# Patient Record
Sex: Male | Born: 1980 | Hispanic: Yes | Marital: Married | Smoking: Former smoker
Health system: Southern US, Community
[De-identification: ages and names within clinical notes are randomized; demographics above are authoritative.]

## PROBLEM LIST (undated history)

## (undated) DIAGNOSIS — K5792 Diverticulitis of intestine, part unspecified, without perforation or abscess without bleeding: Secondary | ICD-10-CM

## (undated) DIAGNOSIS — F319 Bipolar disorder, unspecified: Secondary | ICD-10-CM

## (undated) DIAGNOSIS — F329 Major depressive disorder, single episode, unspecified: Secondary | ICD-10-CM

## (undated) DIAGNOSIS — F32A Depression, unspecified: Secondary | ICD-10-CM

## (undated) DIAGNOSIS — J45909 Unspecified asthma, uncomplicated: Secondary | ICD-10-CM

## (undated) HISTORY — DX: Diverticulitis of intestine, part unspecified, without perforation or abscess without bleeding: K57.92

## (undated) HISTORY — DX: Unspecified asthma, uncomplicated: J45.909

---

## 2010-08-05 ENCOUNTER — Emergency Department (INDEPENDENT_AMBULATORY_CARE_PROVIDER_SITE_OTHER): Payer: Self-pay

## 2010-08-05 ENCOUNTER — Emergency Department (HOSPITAL_BASED_OUTPATIENT_CLINIC_OR_DEPARTMENT_OTHER)
Admission: EM | Admit: 2010-08-05 | Discharge: 2010-08-05 | Disposition: A | Discharge status: Home / Self Care | Payer: Self-pay | Attending: Emergency Medicine | Admitting: Emergency Medicine

## 2010-08-05 DIAGNOSIS — J189 Pneumonia, unspecified organism: Secondary | ICD-10-CM | POA: Insufficient documentation

## 2010-08-05 DIAGNOSIS — R05 Cough: Secondary | ICD-10-CM

## 2010-08-05 DIAGNOSIS — J45909 Unspecified asthma, uncomplicated: Secondary | ICD-10-CM | POA: Insufficient documentation

## 2010-08-05 DIAGNOSIS — R059 Cough, unspecified: Secondary | ICD-10-CM

## 2010-08-05 DIAGNOSIS — R0602 Shortness of breath: Secondary | ICD-10-CM | POA: Insufficient documentation

## 2012-11-06 ENCOUNTER — Emergency Department (HOSPITAL_BASED_OUTPATIENT_CLINIC_OR_DEPARTMENT_OTHER): Payer: Self-pay

## 2012-11-06 ENCOUNTER — Emergency Department (HOSPITAL_BASED_OUTPATIENT_CLINIC_OR_DEPARTMENT_OTHER)
Admission: EM | Admit: 2012-11-06 | Discharge: 2012-11-06 | Disposition: A | Discharge status: Home / Self Care | Payer: Self-pay | Attending: Emergency Medicine | Admitting: Emergency Medicine

## 2012-11-06 ENCOUNTER — Encounter (HOSPITAL_BASED_OUTPATIENT_CLINIC_OR_DEPARTMENT_OTHER): Payer: Self-pay | Admitting: Emergency Medicine

## 2012-11-06 DIAGNOSIS — R112 Nausea with vomiting, unspecified: Secondary | ICD-10-CM | POA: Insufficient documentation

## 2012-11-06 DIAGNOSIS — R0602 Shortness of breath: Secondary | ICD-10-CM | POA: Insufficient documentation

## 2012-11-06 DIAGNOSIS — R091 Pleurisy: Secondary | ICD-10-CM | POA: Insufficient documentation

## 2012-11-06 DIAGNOSIS — D72829 Elevated white blood cell count, unspecified: Secondary | ICD-10-CM | POA: Insufficient documentation

## 2012-11-06 LAB — HEPATIC FUNCTION PANEL
ALT: 32 U/L (ref 0–53)
AST: 26 U/L (ref 0–37)
Bilirubin, Direct: <0.1 mg/dL (ref 0.0–0.3)
Total Protein: 7.4 g/dL (ref 6.0–8.3)

## 2012-11-06 LAB — TROPONIN I
Troponin I: <0.3 ng/mL (ref <0.30)
Troponin I: <0.3 ng/mL (ref <0.30)

## 2012-11-06 LAB — BASIC METABOLIC PANEL
CO2: 23 mEq/L (ref 19–32)
Chloride: 101 mEq/L (ref 96–112)
Creatinine, Ser: 0.7 mg/dL (ref 0.50–1.35)
GFR calc Af Amer: >90 mL/min (ref >90)
Sodium: 137 mEq/L (ref 135–145)

## 2012-11-06 LAB — PRO B NATRIURETIC PEPTIDE: Pro B Natriuretic peptide (BNP): 13.1 pg/mL (ref 0–125)

## 2012-11-06 LAB — CBC
HCT: 46.5 % (ref 39.0–52.0)
Hemoglobin: 16.5 g/dL (ref 13.0–17.0)
RBC: 5.48 MIL/uL (ref 4.22–5.81)
WBC: 15.6 10*3/uL — ABNORMAL HIGH (ref 4.0–10.5)

## 2012-11-06 LAB — D-DIMER, QUANTITATIVE: D-Dimer, Quant: 0.29 ug/mL-FEU (ref 0.00–0.48)

## 2012-11-06 MED ORDER — HYDROMORPHONE HCL PF 1 MG/ML IJ SOLN
1.0000 mg | Freq: Once | INTRAMUSCULAR | Status: AC
  Administered 2012-11-06: 1 mg via INTRAVENOUS
  Filled 2012-11-06: qty 1

## 2012-11-06 MED ORDER — OXYCODONE-ACETAMINOPHEN 5-325 MG PO TABS: 2.0000 | ORAL_TABLET | ORAL | Status: DC | PRN

## 2012-11-06 MED ORDER — ONDANSETRON HCL 4 MG/2ML IJ SOLN
4.0000 mg | Freq: Once | INTRAMUSCULAR | Status: AC
  Administered 2012-11-06: 4 mg via INTRAVENOUS
  Filled 2012-11-06: qty 2

## 2012-11-06 MED ORDER — NAPROXEN 500 MG PO TABS: 500.0000 mg | ORAL_TABLET | Freq: Two times a day (BID) | ORAL | Status: DC

## 2012-11-06 MED ORDER — SIMETHICONE 80 MG PO CHEW
80.0000 mg | CHEWABLE_TABLET | Freq: Once | ORAL | Status: DC
  Filled 2012-11-06: qty 1

## 2012-11-06 MED ORDER — NITROGLYCERIN 0.4 MG SL SUBL
0.4000 mg | SUBLINGUAL_TABLET | SUBLINGUAL | Status: DC | PRN
  Administered 2012-11-06 (×2): 0.4 mg via SUBLINGUAL
  Filled 2012-11-06: qty 25

## 2012-11-06 MED ORDER — MORPHINE SULFATE 4 MG/ML IJ SOLN
4.0000 mg | Freq: Once | INTRAMUSCULAR | Status: AC
  Administered 2012-11-06: 4 mg via INTRAVENOUS
  Filled 2012-11-06: qty 1

## 2012-11-06 MED ORDER — KETOROLAC TROMETHAMINE 30 MG/ML IJ SOLN
30.0000 mg | Freq: Once | INTRAMUSCULAR | Status: AC
  Administered 2012-11-06: 30 mg via INTRAVENOUS
  Filled 2012-11-06: qty 1

## 2012-11-06 MED ORDER — PROMETHAZINE HCL 25 MG PO TABS: 25.0000 mg | ORAL_TABLET | Freq: Four times a day (QID) | ORAL | Status: DC | PRN

## 2012-11-06 MED ORDER — ASPIRIN 81 MG PO CHEW
324.0000 mg | CHEWABLE_TABLET | Freq: Once | ORAL | Status: AC
  Administered 2012-11-06: 324 mg via ORAL
  Filled 2012-11-06: qty 1

## 2012-11-06 NOTE — ED Notes (Signed)
Chest pain for three days that has gotten worse. Pt states pain shooting into his back. Took asa last night and helped but vomited this am and can't make pain stop

## 2012-11-06 NOTE — ED Provider Notes (Signed)
CSN: [id number]     Arrival date & time 11/06/12  1146 History     First MD Initiated Contact with Patient 11/06/12 1214     Chief Complaint  Patient presents with  . Chest Pain   (Consider location/radiation/quality/duration/timing/severity/associated sxs/prior Treatment) HPI Comments: Patient is a 32 year old male with no past medical history who presents with a 3 day history of chest pain. Symptoms started gradually and progressively worsened since the onset. The pain is located in his left chest with radiation to his back. The pain is pleuritic in nature. He reports the pain is sharp and severe. Patient reports associated shortness of breath. He also reports nausea and vomiting started today. He tried taking aspirin for symptoms with no relief. Patient denies any injury to the area. He has no family history of heart disease and he does not smoke. Patient denies recent surgery or previous PE/DVT. Patient states he returned from a car trip to [place] 1 week ago for work. No alleviating factors.    History reviewed. No pertinent past medical history. History reviewed. No pertinent past surgical history. No family history on file. History  Substance Use Topics  . Smoking status: Never Smoker   . Smokeless tobacco: Not on file  . Alcohol Use: Yes     Comment: occ    Review of Systems  Respiratory: Positive for shortness of breath.   Cardiovascular: Positive for chest pain.  Gastrointestinal: Positive for nausea and vomiting.  All other systems reviewed and are negative.    Allergies  Review of patient's allergies indicates no known allergies.  Home Medications  No current outpatient prescriptions on file. BP 141/101  Pulse 96  Temp(Src) 98 F (36.7 C) (Oral)  Resp 16  Ht 5\' 4"  (1.626 m)  Wt 176 lb (79.833 kg)  BMI 30.2 kg/m2  SpO2 100% Physical Exam  Nursing note and vitals reviewed. Constitutional: He is oriented to person, place, and time. He appears  well-developed and well-nourished. No distress.  HENT:  Head: Normocephalic and atraumatic.  Eyes: Conjunctivae and EOM are normal. Pupils are equal, round, and reactive to light.  Neck: Normal range of motion.  Cardiovascular: Normal rate and regular rhythm.  Exam reveals no gallop and no friction rub.   No murmur heard. Pulmonary/Chest: Effort normal and breath sounds normal. He has no wheezes. He has no rales. He exhibits no tenderness.  Chest nontender to palpation. No obvious deformity or bruising noted.   Abdominal: Soft. He exhibits no distension. There is no tenderness. There is no rebound and no guarding.  Musculoskeletal: Normal range of motion.  No calf tenderness or lower extremity edema.   Neurological: He is alert and oriented to person, place, and time. Coordination normal.  Speech is goal-oriented. Moves limbs without ataxia.   Skin: Skin is warm and dry.  Psychiatric: He has a normal mood and affect. His behavior is normal.    ED Course   Procedures (including critical care time)   Date: 11/06/2012  Rate: 99  Rhythm: normal sinus rhythm  QRS Axis: right  Intervals: normal  ST/T Wave abnormalities: normal  Conduction Disutrbances:none  Narrative Interpretation: NSR with right axis with mild diffuse ST elevation without old for comparison  Old EKG Reviewed: none available   Labs Reviewed  CBC - Abnormal; Notable for the following:    WBC 15.6 (*)    All other components within normal limits  BASIC METABOLIC PANEL - Abnormal; Notable for the following:  Glucose, Bld 102 (*)    All other components within normal limits  LIPASE, BLOOD - Abnormal; Notable for the following:    Lipase 158 (*)    All other components within normal limits  PRO B NATRIURETIC PEPTIDE  TROPONIN I  D-DIMER, QUANTITATIVE  HEPATIC FUNCTION PANEL  TROPONIN I   Dg Chest 2 View  11/06/2012   *RADIOLOGY REPORT*  Clinical Data: Left chest pain for 3 days radiating to back  CHEST - 2  VIEW  Comparison: 08/05/2010  Findings: Normal heart size, mediastinal contours, and pulmonary vascularity. Lungs clear. Bones unremarkable. No pneumothorax.  IMPRESSION: Normal exam.   Original Report Authenticated By: Ulyses Southward, M.D.   1. Pleurisy     MDM  12:55 PM Labs show elevated WBC without other acute changes. Chest xray pending. Patient is tachycardic and afebrile.   5:17 PM Chest xray unremarkable. First and second troponin unremarkable. Lipase elevated at 158. D-dimer negative. Patient complaining of persistent pain. Patient given morphine and toradol. I will order dilaudid. I spoke with Dr. Patria Mane about the patient who thinks the patient may have pleurisy vs. Pericarditis. Patient will be treated with anti-inflammatory medications, phenergan and percocet as needed for worsening pain. I doubt cardiac or pulmonary process at this time due to negative testing. Patient instructed to return with worsening or concerning symptoms. Patient and wife agreeable to this plan. No further evaluation needed at this time.      Emilia Beck, [place] 11/06/12 2327

## 2012-11-06 NOTE — ED Notes (Signed)
Lungs clears, chest tender at palpation. Daily job do require heavy lifting.

## 2012-11-10 NOTE — ED Provider Notes (Signed)
Medical screening examination/treatment/procedure(s) were performed by non-physician practitioner and as supervising physician I was immediately available for consultation/collaboration.   Candyce Churn, MD 11/10/12 1006

## 2013-01-04 ENCOUNTER — Emergency Department (HOSPITAL_BASED_OUTPATIENT_CLINIC_OR_DEPARTMENT_OTHER)
Admission: EM | Admit: 2013-01-04 | Discharge: 2013-01-04 | Disposition: A | Discharge status: Home / Self Care | Payer: Self-pay | Attending: Emergency Medicine | Admitting: Emergency Medicine

## 2013-01-04 ENCOUNTER — Encounter (HOSPITAL_BASED_OUTPATIENT_CLINIC_OR_DEPARTMENT_OTHER): Payer: Self-pay | Admitting: Emergency Medicine

## 2013-01-04 DIAGNOSIS — J029 Acute pharyngitis, unspecified: Secondary | ICD-10-CM | POA: Insufficient documentation

## 2013-01-04 DIAGNOSIS — Z791 Long term (current) use of non-steroidal anti-inflammatories (NSAID): Secondary | ICD-10-CM | POA: Insufficient documentation

## 2013-01-04 DIAGNOSIS — J069 Acute upper respiratory infection, unspecified: Secondary | ICD-10-CM | POA: Insufficient documentation

## 2013-01-04 LAB — RAPID STREP SCREEN (MED CTR MEBANE ONLY): Streptococcus, Group A Screen (Direct): NEGATIVE

## 2013-01-04 MED ORDER — ALBUTEROL SULFATE HFA 108 (90 BASE) MCG/ACT IN AERS: 1.0000 | INHALATION_SPRAY | Freq: Four times a day (QID) | RESPIRATORY_TRACT | Status: AC | PRN

## 2013-01-04 MED ORDER — LORATADINE 10 MG PO TABS: 10.0000 mg | ORAL_TABLET | Freq: Every day | ORAL | Status: DC

## 2013-01-04 MED ORDER — ALBUTEROL SULFATE (5 MG/ML) 0.5% IN NEBU
2.5000 mg | INHALATION_SOLUTION | Freq: Once | RESPIRATORY_TRACT | Status: AC
  Administered 2013-01-04: 2.5 mg via RESPIRATORY_TRACT
  Filled 2013-01-04: qty 0.5

## 2013-01-04 MED ORDER — FLUTICASONE PROPIONATE 50 MCG/ACT NA SUSP: 2.0000 | Freq: Every day | NASAL | Status: DC

## 2013-01-04 NOTE — ED Notes (Signed)
Pt developed sore throat, headache and reported fever at home

## 2013-01-04 NOTE — ED Provider Notes (Signed)
CSN: [id number]     Arrival date & time 01/04/13  0319 History   First MD Initiated Contact with Patient 01/04/13 0327     Chief Complaint  Patient presents with  . Headache  . Fever   (Consider location/radiation/quality/duration/timing/severity/associated sxs/prior Treatment) Patient is a 32 y.o. male presenting with URI. The history is provided by the patient.  URI Presenting symptoms: congestion, cough and sore throat   Presenting symptoms: no fever   Congestion:    Location:  Nasal   Interferes with sleep: no     Interferes with eating/drinking: no   Cough:    Cough characteristics:  Non-productive   Severity:  Mild   Onset quality:  Gradual   Timing:  Intermittent Sore throat:    Severity:  Moderate   Onset quality:  Gradual   Timing:  Constant   Progression:  Unchanged Severity:  Moderate Onset quality:  Gradual Timing:  Constant Progression:  Unchanged Chronicity:  New Relieved by:  Nothing Worsened by:  Nothing tried Associated symptoms: wheezing   Associated symptoms: no arthralgias and no neck pain   Risk factors: not elderly, no recent illness, no recent travel and no sick contacts     History reviewed. No pertinent past medical history. History reviewed. No pertinent past surgical history. History reviewed. No pertinent family history. History  Substance Use Topics  . Smoking status: Never Smoker   . Smokeless tobacco: Not on file  . Alcohol Use: Yes     Comment: occ    Review of Systems  Constitutional: Negative for fever.  HENT: Positive for congestion and sore throat.   Respiratory: Positive for cough and wheezing.   Cardiovascular: Negative for chest pain, palpitations and leg swelling.  Musculoskeletal: Negative for arthralgias and neck pain.  All other systems reviewed and are negative.    Allergies  Review of patient's allergies indicates no known allergies.  Home Medications   Current Outpatient Rx  Name  Route  Sig  Dispense   Refill  . naproxen (NAPROSYN) 500 MG tablet   Oral   Take 1 tablet (500 mg total) by mouth 2 (two) times daily with a meal.   30 tablet   0   . oxyCODONE-acetaminophen (PERCOCET/ROXICET) 5-325 MG per tablet   Oral   Take 2 tablets by mouth every 4 (four) hours as needed for pain.   20 tablet   0   . promethazine (PHENERGAN) 25 MG tablet   Oral   Take 1 tablet (25 mg total) by mouth every 6 (six) hours as needed for nausea.   12 tablet   0    BP 121/84  Pulse 97  Temp(Src) 98.9 F (37.2 C) (Oral)  Resp 20  Ht 5\' 4"  (1.626 m)  Wt 176 lb (79.833 kg)  BMI 30.2 kg/m2  SpO2 98% Physical Exam  Constitutional: He is oriented to person, place, and time. He appears well-developed and well-nourished. No distress.  HENT:  Head: Normocephalic and atraumatic.  Mouth/Throat: Oropharynx is clear and moist. No oropharyngeal exudate.  Eyes: Conjunctivae are normal. Pupils are equal, round, and reactive to light.  Neck: Normal range of motion. Neck supple. No thyromegaly present.  Cardiovascular: Normal rate and regular rhythm.   Pulmonary/Chest: Effort normal. No stridor. He has wheezes. He has no rales.  Abdominal: Soft. Bowel sounds are normal. There is no tenderness. There is no rebound and no guarding.  Musculoskeletal: Normal range of motion.  Neurological: He is alert and oriented to person, place,  and time.  Skin: Skin is warm and dry. No rash noted.  Psychiatric: He has a normal mood and affect.    ED Course  Procedures (including critical care time) Labs Review Labs Reviewed - No data to display Imaging Review No results found.  EKG Interpretation   None       MDM  No diagnosis found. URI will treat symptomatically    Shariyah Eland K Wess Baney-Rasch, MD 01/04/13 [phone]

## 2013-01-06 LAB — CULTURE, GROUP A STREP

## 2013-03-04 ENCOUNTER — Encounter (HOSPITAL_BASED_OUTPATIENT_CLINIC_OR_DEPARTMENT_OTHER): Payer: Self-pay | Admitting: Emergency Medicine

## 2013-03-04 ENCOUNTER — Emergency Department (HOSPITAL_BASED_OUTPATIENT_CLINIC_OR_DEPARTMENT_OTHER)
Admission: EM | Admit: 2013-03-04 | Discharge: 2013-03-04 | Disposition: A | Discharge status: Home / Self Care | Payer: Self-pay | Attending: Emergency Medicine | Admitting: Emergency Medicine

## 2013-03-04 ENCOUNTER — Emergency Department (HOSPITAL_BASED_OUTPATIENT_CLINIC_OR_DEPARTMENT_OTHER): Payer: Self-pay

## 2013-03-04 DIAGNOSIS — S8253XA Displaced fracture of medial malleolus of unspecified tibia, initial encounter for closed fracture: Secondary | ICD-10-CM | POA: Insufficient documentation

## 2013-03-04 DIAGNOSIS — S8252XA Displaced fracture of medial malleolus of left tibia, initial encounter for closed fracture: Secondary | ICD-10-CM

## 2013-03-04 DIAGNOSIS — Z791 Long term (current) use of non-steroidal anti-inflammatories (NSAID): Secondary | ICD-10-CM | POA: Insufficient documentation

## 2013-03-04 DIAGNOSIS — Y9289 Other specified places as the place of occurrence of the external cause: Secondary | ICD-10-CM | POA: Insufficient documentation

## 2013-03-04 DIAGNOSIS — Y9389 Activity, other specified: Secondary | ICD-10-CM | POA: Insufficient documentation

## 2013-03-04 DIAGNOSIS — X500XXA Overexertion from strenuous movement or load, initial encounter: Secondary | ICD-10-CM | POA: Insufficient documentation

## 2013-03-04 DIAGNOSIS — Z79899 Other long term (current) drug therapy: Secondary | ICD-10-CM | POA: Insufficient documentation

## 2013-03-04 MED ORDER — HYDROCODONE-ACETAMINOPHEN 5-325 MG PO TABS: 2.0000 | ORAL_TABLET | ORAL | Status: DC | PRN

## 2013-03-04 MED ORDER — HYDROCODONE-ACETAMINOPHEN 5-325 MG PO TABS
2.0000 | ORAL_TABLET | Freq: Once | ORAL | Status: AC
  Administered 2013-03-04: 2 via ORAL
  Filled 2013-03-04: qty 2

## 2013-03-04 NOTE — ED Provider Notes (Signed)
Medical screening examination/treatment/procedure(s) were performed by non-physician practitioner and as supervising physician I was immediately available for consultation/collaboration.  EKG Interpretation   None        Doug Sou, MD 03/04/13 2308

## 2013-03-04 NOTE — ED Notes (Signed)
States twisted left ankle yesterday pm   Ankle bruised swollen painful

## 2013-03-04 NOTE — ED Provider Notes (Signed)
CSN: [id number]     Arrival date & time 03/04/13  2007 History   First MD Initiated Contact with Patient 03/04/13 2046     Chief Complaint  Patient presents with  . Ankle Pain   (Consider location/radiation/quality/duration/timing/severity/associated sxs/prior Treatment) Patient is a 32 y.o. male presenting with ankle pain. The history is provided by the patient. No language interpreter was used.  Ankle Pain Location:  Ankle Ankle location:  L ankle Pain details:    Quality:  Aching   Radiates to:  Does not radiate   Severity:  Moderate   Timing:  Constant   Progression:  Worsening Chronicity:  New Dislocation: no   Prior injury to area:  No Relieved by:  Nothing Worsened by:  Nothing tried   History reviewed. No pertinent past medical history. History reviewed. No pertinent past surgical history. No family history on file. History  Substance Use Topics  . Smoking status: Never Smoker   . Smokeless tobacco: Not on file  . Alcohol Use: Yes     Comment: occ    Review of Systems  Musculoskeletal: Positive for joint swelling and myalgias.  All other systems reviewed and are negative.    Allergies  Review of patient's allergies indicates no known allergies.  Home Medications   Current Outpatient Rx  Name  Route  Sig  Dispense  Refill  . albuterol (PROVENTIL HFA;VENTOLIN HFA) 108 (90 BASE) MCG/ACT inhaler   Inhalation   Inhale 1-2 puffs into the lungs every 6 (six) hours as needed for wheezing.   1 Inhaler   0   . loratadine (CLARITIN) 10 MG tablet   Oral   Take 1 tablet (10 mg total) by mouth daily.   5 tablet   0   . naproxen (NAPROSYN) 500 MG tablet   Oral   Take 1 tablet (500 mg total) by mouth 2 (two) times daily with a meal.   30 tablet   0    BP 130/85  Pulse 93  Temp(Src) 97.9 F (36.6 C) (Oral)  Resp 18  Ht 5\' 4"  (1.626 m)  Wt 178 lb (80.74 kg)  BMI 30.54 kg/m2  SpO2 100% Physical Exam  Constitutional: He is oriented to person, place,  and time. He appears well-developed and well-nourished.  Musculoskeletal: He exhibits tenderness.  Bruised swollen ankle and foot   nv and ns intaact  Neurological: He is alert and oriented to person, place, and time. He has normal reflexes.  Skin: There is erythema.  Psychiatric: He has a normal mood and affect.    ED Course  Procedures (including critical care time) Labs Review Labs Reviewed - No data to display Imaging Review Dg Ankle Complete Left  03/04/2013   CLINICAL DATA:  32 year old male with ankle injury, pain and swelling.  EXAM: LEFT ANKLE COMPLETE - 3+ VIEW  COMPARISON:  None.  FINDINGS: A tiny fracture at the tip of the medial malleolus is noted.  There is no evidence of subluxation or dislocation.  The tailor dome is unremarkable.  Soft tissue swelling and small ankle effusion is noted.  No focal bony lesions are present.  IMPRESSION: Medial malleolar tip fracture with soft tissue swelling and small ankle effusion.   Electronically Signed   By: Laveda Abbe M.D.   On: 03/04/2013 20:47    EKG Interpretation   None       MDM   1. Fractured medial malleolus, left, closed, initial encounter    Splint, crutches,  Hydrocodone rx.  Pt advised to follow up with Dr. Dion Saucier for recheck in 3-4 days.   Pt advised ice and elevation    Lonia Skinner [place], [place] 03/04/13 2112

## 2013-05-08 ENCOUNTER — Emergency Department (HOSPITAL_BASED_OUTPATIENT_CLINIC_OR_DEPARTMENT_OTHER)
Admission: EM | Admit: 2013-05-08 | Discharge: 2013-05-08 | Disposition: A | Discharge status: Home / Self Care | Payer: Self-pay | Attending: Emergency Medicine | Admitting: Emergency Medicine

## 2013-05-08 ENCOUNTER — Encounter (HOSPITAL_BASED_OUTPATIENT_CLINIC_OR_DEPARTMENT_OTHER): Payer: Self-pay | Admitting: Emergency Medicine

## 2013-05-08 DIAGNOSIS — Z79899 Other long term (current) drug therapy: Secondary | ICD-10-CM | POA: Insufficient documentation

## 2013-05-08 DIAGNOSIS — K0889 Other specified disorders of teeth and supporting structures: Secondary | ICD-10-CM

## 2013-05-08 DIAGNOSIS — K089 Disorder of teeth and supporting structures, unspecified: Secondary | ICD-10-CM | POA: Insufficient documentation

## 2013-05-08 DIAGNOSIS — Z791 Long term (current) use of non-steroidal anti-inflammatories (NSAID): Secondary | ICD-10-CM | POA: Insufficient documentation

## 2013-05-08 DIAGNOSIS — K029 Dental caries, unspecified: Secondary | ICD-10-CM | POA: Insufficient documentation

## 2013-05-08 DIAGNOSIS — K0381 Cracked tooth: Secondary | ICD-10-CM | POA: Insufficient documentation

## 2013-05-08 MED ORDER — PENICILLIN V POTASSIUM 500 MG PO TABS: 500.0000 mg | ORAL_TABLET | Freq: Four times a day (QID) | ORAL | Status: AC

## 2013-05-08 MED ORDER — HYDROCODONE-ACETAMINOPHEN 5-325 MG PO TABS
1.0000 | ORAL_TABLET | Freq: Once | ORAL | Status: AC
  Administered 2013-05-08: 1 via ORAL
  Filled 2013-05-08: qty 1

## 2013-05-08 MED ORDER — IBUPROFEN 600 MG PO TABS: 600.0000 mg | ORAL_TABLET | Freq: Four times a day (QID) | ORAL | Status: DC | PRN

## 2013-05-08 NOTE — ED Notes (Signed)
States that he is having right side tooth pain that is causing the right side of his face and head to hurt. States when he lays down his head hurts worse.

## 2013-05-08 NOTE — Discharge Instructions (Signed)
Dental Pain Toothache is pain in or around a tooth. It may get worse with chewing or with cold or heat.  HOME CARE  Your dentist may use a numbing medicine during treatment. If so, you may need to avoid eating until the medicine wears off. Ask your dentist about this.  Only take medicine as told by your dentist or doctor.  Avoid chewing food near the painful tooth until after all treatment is done. Ask your dentist about this. GET HELP RIGHT AWAY IF:   The problem gets worse or new problems appear.  You have a fever.  There is redness and puffiness (swelling) of the face, jaw, or neck.  You cannot open your mouth.  There is pain in the jaw.  There is very bad pain that is not helped by medicine. MAKE SURE YOU:   Understand these instructions.  Will watch your condition.  Will get help right away if you are not doing well or get worse. Document Released: 08/20/2007 Document Revised: 05/26/2011 Document Reviewed: 08/20/2007 [facility] Patient Information 2014 [place], [place].   Emergency Department Resource Guide 1) Find a Doctor and Pay Out of Pocket Although you won't have to find out who is covered by your insurance plan, it is a good idea to ask around and get recommendations. You will then need to call the office and see if the doctor you have chosen will accept you as a new patient and what types of options they offer for patients who are self-pay. Some doctors offer discounts or will set up payment plans for their patients who do not have insurance, but you will need to ask so you aren't surprised when you get to your appointment.  2) Contact Your Local Health Department Not all health departments have doctors that can see patients for sick visits, but many do, so it is worth a call to see if yours does. If you don't know where your local health department is, you can check in your phone book. The CDC also has a tool to help you locate your state's health department, and many  state websites also have listings of all of their local health departments.  3) Find a [facility] If your illness is not likely to be very severe or complicated, you may want to try a walk in clinic. These are popping up all over the country in pharmacies, drugstores, and shopping centers. They're usually staffed by nurse practitioners or physician assistants that have been trained to treat common illnesses and complaints. They're usually fairly quick and inexpensive. However, if you have serious medical issues or chronic medical problems, these are probably not your best option.  No Primary Care Doctor: - Call Health Connect at  [phone] - they can help you locate a primary care doctor that  accepts your insurance, provides certain services, etc. - Physician Referral Service- [phone]  Chronic Pain Problems: Organization         Address  Phone   Notes  [facility]  [phone] Patients need to be referred by their primary care doctor.   Medication Assistance: Organization         Address  Phone   Notes  [facility] [address] [place],  [address] [phone] --Must be a resident of [facility] -- Must have NO insurance coverage whatsoever (no Medicaid/ Medicare, etc.) -- The pt. MUST have a primary care doctor that directs their care regularly and follows  them in the community   [facility]  [phone]   [employer]  [phone]    Agencies that provide inexpensive medical care: Organization         Address  Phone   Notes  [place]  [phone]   [facility]    [phone]   [facility] [place], [place] [address] [phone]   [place] [address], [place] [phone]   [facility]anned [facility]    [phone]   [facility]    [phone]   [place] and  [address], [place] Phone:  [phone], Fax:  [phone] Hours of Operation:  9 am - 6 pm, M-F.  Also accepts Medicaid/Medicare and self-pay.  [facility] for [address], [place] Phone: [phone], Fax: [phone]. Hours of Operation:  8:30 am - 5:30 pm, M-F.  Also accepts Medicaid and self-pay.  [facility] [place] [address] [place] Phone: [phone]   [place], [place], [place] [phone], Ext. [phone] Mondays & Thursdays: 7-9 AM.  First 15 patients are seen on a first come, first serve basis.    [place] Providers:  Organization         Address  Phone   Notes  [facility] [address], [place] [phone] Also accepts self-pay patients.  [facility] [address], [place]  [phone]   [place], [address], [place] [phone]   [facility] [address], [place] [phone]   Lucianne Lei [address], [place]   [phone] Only accepts [place] patients after they have their name applied to their card.   Self-Pay (no insurance) in [facility]:  Organization         Address  Phone   Notes  Sickle Cell Patients, [facility] [place] [phone]   [facility] [place] [phone]   [facility] [place]  [place], [place], [place] [phone]   [facility]/Dr. Osei-Bonsu  [address], [place] or [address], [place] [phone] Phone number for both Institute and [place] locations is the same.  [facility] [address], [place] [phone]   [facility] [address] [place] or [address] [phone]   [facility] [address], [place] [phone], phone; [phone], fax Sees patients 1st and 3rd Saturday of every month.  Must not qualify for public or private insurance (i.e. Medicaid, Medicare, [place] Health Choice, Veterans' Benefits)  Household income should be no more than 200% of the poverty level The clinic cannot treat you if you are pregnant or think you are pregnant  Sexually transmitted diseases are not treated at the clinic.    Dental Care: Organization         Address  Phone  Notes  [facility] [address] [place], [place] [phone] Accepts children up to age 15 who are enrolled  in Medicaid or [place] Health Choice; pregnant women with a Medicaid card; and children who have applied for Medicaid or [place] Health Choice, but were declined, whose parents can pay a reduced fee at time of service.  °[facility] [place]  [address], [place] ([phone]) [phone] Accepts children up to age 21 who are enrolled in Medicaid or [place] Health Choice; pregnant women with a Medicaid card; and children who have applied for Medicaid or [place] Health Choice, but were declined, whose parents can pay a reduced fee at time of service.  °[facility] ° [address], [place] ([phone]) [phone] Patients are seen by appointment only. Walk-ins are not accepted. [facility] will see patients 18 years of age and older. °Monday - Tuesday (8am-5pm) °Most Wednesdays (8:30-5pm) °$30 per visit, cash only  °[facility] ° [address], [place] ([phone]) [phone] Patients are seen by appointment only. Walk-ins are not accepted. [facility] will see patients 18 years of age and older. °One Wednesday Evening (Monthly: Volunteer Based).  $30 per visit, cash only  °[facility]  [phone] for adults; Children under age 4, call Graduate Pediatric Dentistry at [phone]. Children aged 4-14, please call [phone] to request a pediatric application. ° Dental services are provided in all areas of dental care including fillings, crowns and bridges, complete and partial dentures, implants, gum treatment, root canals, and extractions. Preventive care is also provided. Treatment is provided to both adults and children. °Patients are selected via a lottery and there is often a waiting list. °  °[facility] [address], °[place] ° (336) [phone] [email or web address] °  °[facility] [address], [place], [place] (336)[phone], Ext. [phone] Second and Fourth Thursday of each month, opens at 6:30 AM; Clinic ends at 9 AM.  Patients are seen on a first-come first-served basis, and a limited number are seen during each clinic.  ° °[facility] ° [address], [place], [place] ([phone]) [phone]   Eligibility Requirements °You must have lived in [place], [place], or [place] counties for at least the last three months. °  You cannot be eligible for state or federal sponsored healthcare insurance, including Veterans Administration, Medicaid, or Medicare. °  You generally cannot be eligible for healthcare insurance through your employer.  °  How to apply: °Eligibility screenings are held every Tuesday and Wednesday afternoon from 1:00 pm until 4:00 pm. You do not need an appointment for the interview!  °[facility] [address], [place], [place] [phone]   °[facility]  336-342-8273   °[facility]  336-703-3100   °[facility]  336-570-6415   ° °Behavioral Health Resources in the Community: °Intensive Outpatient Programs °Organization         Address  Phone  Notes  °[facility] [address], [place], [place]  336-878-6098   °[facility] [address], [place], [place] 336-832-9800   °[facility] [address], [place], [place] ° 336-882-2125   °[facility] [address],  °[place], [place] 1-800-853-5163 or 336-641-4981   °Substance Abuse Resources °Organization         Address  Phone  Notes  °[facility]  336-882-2125   °[facility]  336-784-9470   °  [facility]  336-285-9073   °[facility]  336-845-3988   °Residential & Outpatient Substance Abuse Program  1-800-659-3381   °Psychological Services °Organization         Address  Phone  Notes  °[facility]  336- 832-9600   °[facility]  336- 378-7881   °[facility] [address], [place] 1-800-853-5163 or 336-641-4981   ° °Mobile Crisis Teams °Organization         Address  Phone  Notes  °[facility], Mobile Crisis Care Unit  1-877-626-1772   °Assertive °[facility] ° [address] [place], [place] 336-834-9664   °Sharon DeEsch [address] °[place] 336-554-5454   ° °Self-Help/Support Groups °Organization         Address  Phone             Notes  °[facility] [place] - variety of support groups  336- 373-1402 Call for more information  °Narcotics Anonymous (NA), [facility] [address], °[place]  2 meetings at this location  ° °Residential Treatment Programs °Organization         Address  Phone  Notes  °[facility] [address],    °[place]  1-866-801-8205   °[facility] ° [address], [place], [place] [phone]   °[facility] [address], [place] 336-845-3988 Admissions: 8am-3pm M-F  °[facility] [address]    °[place], [place] 336-841-1104   °[facility] [address], [place], [place] 336-379-7146   °[facility] [address]    °[place], [place] 336-285-9073   °[facility] - Intensive Outpatient [address], [place], [place] 336-852-3033   °[facility] [address]  °[place], [place] 1-877-615-2722 or 336-784-9470   °[facility] ([facility]) [address] [place], [place] 336-227-7417 Accepts Medicaid  °[facility] [address]  °[place] 1-800-659-3381 Substance Abuse/Addiction Treatment  ° °[place] [facility] Resources °Organization         Address  Phone  Notes  °[facility]  (888) 581-9988   °Julie Brannon, PhD [address] [place], [place]   (336) 349-5553 or (336) 951-0000   °[facility]   [address] °[place], [place] (336) 349-4454   °[facility] [address], [place], [place] (336) 342-8316 Insurance/Medicaid/sponsorship through [facility]  °[facility] [address]                                    [place], [place] (336) 342-8316 Therapy/tele-psych/case  °[facility] [address]  ° [place],  (336) 349-2233    °Dr. Arfeen  (336) 349-4544   °[facility] [place]  [employer] [place] [facility] 1) [address], [place] °2) [address], [place] °3)  [address], [place] (336) 349-3220 °(336) 342-7768 ° °(336) 342-8140   °[place] Child Abuse Hotline (336) 342-1394 or (336) 342-3537 (After Hours)    ° ° ° °

## 2013-05-10 NOTE — ED Provider Notes (Signed)
CSN: [id number]     Arrival date & time 05/08/13  1348 History   First MD Initiated Contact with Patient 05/08/13 1403     Chief Complaint  Patient presents with  . Dental Pain     (Consider location/radiation/quality/duration/timing/severity/associated sxs/prior Treatment) HPI  This is a 33 year old male with no significant past medical history who presents with right tooth pain. Patient reports onset of symptoms 3 days ago. He states that it hurts to lay on his right cheek. He states that he has tried 200 mg of ibuprofen at home without any relief of his pain. He has not seen a dentist. He denies any fevers. He denies any difficulty breathing or swallowing.  History reviewed. No pertinent past medical history. History reviewed. No pertinent past surgical history. No family history on file. History  Substance Use Topics  . Smoking status: Never Smoker   . Smokeless tobacco: Not on file  . Alcohol Use: Yes     Comment: occ    Review of Systems  Constitutional: Negative for fever.  HENT: Positive for dental problem. Negative for ear pain, facial swelling, sore throat and trouble swallowing.   All other systems reviewed and are negative.      Allergies  Review of patient's allergies indicates no known allergies.  Home Medications   Current Outpatient Rx  Name  Route  Sig  Dispense  Refill  . albuterol (PROVENTIL HFA;VENTOLIN HFA) 108 (90 BASE) MCG/ACT inhaler   Inhalation   Inhale 1-2 puffs into the lungs every 6 (six) hours as needed for wheezing.   1 Inhaler   0   . HYDROcodone-acetaminophen (NORCO/VICODIN) 5-325 MG per tablet   Oral   Take 2 tablets by mouth every 4 (four) hours as needed.   20 tablet   0   . ibuprofen (ADVIL,MOTRIN) 600 MG tablet   Oral   Take 1 tablet (600 mg total) by mouth every 6 (six) hours as needed.   30 tablet   0   . loratadine (CLARITIN) 10 MG tablet   Oral   Take 1 tablet (10 mg total) by mouth daily.   5 tablet   0   .  naproxen (NAPROSYN) 500 MG tablet   Oral   Take 1 tablet (500 mg total) by mouth 2 (two) times daily with a meal.   30 tablet   0   . penicillin v potassium (VEETID) 500 MG tablet   Oral   Take 1 tablet (500 mg total) by mouth 4 (four) times daily.   40 tablet   0    BP 133/85  Pulse 83  Temp(Src) 98.1 F (36.7 C) (Oral)  Resp 20  Ht 5\' 4"  (1.626 m)  Wt 176 lb (79.833 kg)  BMI 30.20 kg/m2  SpO2 99% Physical Exam  Nursing note and vitals reviewed. Constitutional: He is oriented to person, place, and time. He appears well-developed and well-nourished. No distress.  HENT:  Head: Normocephalic and atraumatic.  Multiple dental caries, broken right lower premolar with associated dental., No abscess, no associated facial swelling or skin changes  Cardiovascular: Normal rate and regular rhythm.   Pulmonary/Chest: Effort normal. No respiratory distress.  Lymphadenopathy:    He has no cervical adenopathy.  Neurological: He is alert and oriented to person, place, and time.  Skin: Skin is warm and dry.  Psychiatric: He has a normal mood and affect.    ED Course  Procedures (including critical care time) Labs Review Labs Reviewed - No data  to display Imaging Review No results found.  EKG Interpretation   None       MDM   Final diagnoses:  Pain, dental    Patient presents with uncomplicated dental pain. Multiple dental caries and broken right lower premolar.  Patient has no evidence of abscess or deep space infection. Patient was given Norco. He was encouraged to use ibuprofen 600 mg every 6 hours at home for pain. He was given a resource guide and instructed to followup with the dentist. Patient will also be covered with penicillin.  After history, exam, and medical workup I feel the patient has been appropriately medically screened and is safe for discharge home. Pertinent diagnoses were discussed with the patient. Patient was given return precautions.     Shon Baton, MD 05/10/13 [phone]

## 2013-09-23 ENCOUNTER — Encounter (HOSPITAL_BASED_OUTPATIENT_CLINIC_OR_DEPARTMENT_OTHER): Payer: Self-pay | Admitting: Emergency Medicine

## 2013-09-23 ENCOUNTER — Emergency Department (HOSPITAL_BASED_OUTPATIENT_CLINIC_OR_DEPARTMENT_OTHER)
Admission: EM | Admit: 2013-09-23 | Discharge: 2013-09-23 | Disposition: A | Discharge status: Home / Self Care | Payer: Self-pay | Attending: Emergency Medicine | Admitting: Emergency Medicine

## 2013-09-23 DIAGNOSIS — Z791 Long term (current) use of non-steroidal anti-inflammatories (NSAID): Secondary | ICD-10-CM | POA: Insufficient documentation

## 2013-09-23 DIAGNOSIS — Z09 Encounter for follow-up examination after completed treatment for conditions other than malignant neoplasm: Secondary | ICD-10-CM | POA: Insufficient documentation

## 2013-09-23 DIAGNOSIS — Z79899 Other long term (current) drug therapy: Secondary | ICD-10-CM | POA: Insufficient documentation

## 2013-09-23 NOTE — Discharge Instructions (Signed)
You were seen today requesting identification of medications given while in prison. Given that he has been out of prison for the last 2 weeks, unlikely that blood testing will help. He [facility] or call the jail to inquire about your medical records.

## 2013-09-23 NOTE — ED Provider Notes (Addendum)
CSN: [id number]     Arrival date & time 09/23/13  1007 History   First MD Initiated Contact with Patient 09/23/13 1022     Chief Complaint  Patient presents with  . Abdominal Pain     (Consider location/radiation/quality/duration/timing/severity/associated sxs/prior Treatment) HPI  This is a 33 year old male who presents requesting screening for "the medications they gave me in jail."  Patient reports that he spent 8 days in jail at the end of June. Was discharged and subsequently taken to [facility] where he reports that he was admitted for 7 days to the "mental health part."  Patient states that while he was in jail he was given medications that "made me feel like I need to punch someone." He states that he hid the pills in his shoes and gave him to the doctor's at the [facility]. However, he was never told what the pills were.  He is requesting testing. He has brought his medical records from [facility]. He denies any physical complaints at this time. He was discharged from [facility] with multiple prescriptions which she has not gotten filled. The prescriptions include Resperdal, Cogentin, and Klonopin.  Of note, patient reported abdominal pain to the Triage nurse.  Denies to me at this time.  No past medical history on file. No past surgical history on file. No family history on file. History  Substance Use Topics  . Smoking status: Never Smoker   . Smokeless tobacco: Not on file  . Alcohol Use: Yes     Comment: occ    Review of Systems  Constitutional: Negative for fever.  Respiratory: Negative for cough, chest tightness and shortness of breath.   Cardiovascular: Negative for chest pain.  Gastrointestinal: Negative for abdominal pain.  Neurological: Negative for headaches.  Psychiatric/Behavioral: Negative for suicidal ideas, hallucinations, sleep disturbance and agitation.  All other systems reviewed and are negative.     Allergies  Review of patient's  allergies indicates no known allergies.  Home Medications   Prior to Admission medications   Medication Sig Start Date End Date Taking? Authorizing Provider  albuterol (PROVENTIL HFA;VENTOLIN HFA) 108 (90 BASE) MCG/ACT inhaler Inhale 1-2 puffs into the lungs every 6 (six) hours as needed for wheezing. 01/04/13   April K Palumbo-Rasch, MD  HYDROcodone-acetaminophen (NORCO/VICODIN) 5-325 MG per tablet Take 2 tablets by mouth every 4 (four) hours as needed. 03/04/13   Elson Areas, PA-C  ibuprofen (ADVIL,MOTRIN) 600 MG tablet Take 1 tablet (600 mg total) by mouth every 6 (six) hours as needed. 05/08/13   Shon Baton, MD  loratadine (CLARITIN) 10 MG tablet Take 1 tablet (10 mg total) by mouth daily. 01/04/13   April K Palumbo-Rasch, MD  naproxen (NAPROSYN) 500 MG tablet Take 1 tablet (500 mg total) by mouth 2 (two) times daily with a meal. 11/06/12   Kaitlyn Szekalski, PA-C   BP 142/77  Pulse 91  Temp(Src) 98.1 F (36.7 C) (Oral)  Resp 16  Ht 5\' 1"  (1.549 m)  Wt 176 lb (79.833 kg)  BMI 33.27 kg/m2  SpO2 99% Physical Exam  Nursing note and vitals reviewed. Constitutional: He is oriented to person, place, and time. He appears well-developed and well-nourished. No distress.  HENT:  Head: Normocephalic and atraumatic.  Cardiovascular: Normal rate and regular rhythm.   Pulmonary/Chest: Effort normal. No respiratory distress.  Musculoskeletal: He exhibits no edema.  Neurological: He is alert and oriented to person, place, and time.  Skin: Skin is warm  and dry.  Psychiatric:  Excitable but redirectable    ED Course  Procedures (including critical care time) Labs Review Labs Reviewed - No data to display  Imaging Review No results found.   EKG Interpretation None      MDM   Final diagnoses:  Follow up    Patient presents requesting evaluation for medications administered to him over 2 weeks ago while in prison. He is nontoxic on exam. He has no physical complaints. It  appears he has been admitted to the hospital since being released from prison for psychiatric assessment.  I reviewed the patient's outpatient records, he was positive for THC. Otherwise his toxicology screen was unremarkable. Discussed with patient that I would be unable to identify any medications given to him over 2 weeks ago.  He will need to [facility] or call the jail if he wants to no was given to him while in jail.  After history, exam, and medical workup I feel the patient has been appropriately medically screened and is safe for discharge home. Pertinent diagnoses were discussed with the patient. Patient was given return precautions.    Shon Batonourtney F Horton, MD 09/23/13 1043  Shon Batonourtney F Horton, MD 09/23/13 [phone]

## 2013-09-23 NOTE — ED Notes (Addendum)
Pt reports abdominal pain, feeling "hot" - that started this morning. Pt reports he was in court this morning when his pain started. States he was released from jail a few weeks ago - reports that upon jail release [facility]" and had a 6 day mental health stay. Pt denies mental health history at this time. States he is here today because he "wants to know what pills he is taking because they are making him feel bad." Denies suicidal thoughts, ideations and denies homicidal thoughts and ideations. Denies hearing voices.

## 2013-12-10 ENCOUNTER — Emergency Department (HOSPITAL_BASED_OUTPATIENT_CLINIC_OR_DEPARTMENT_OTHER)
Admission: EM | Admit: 2013-12-10 | Discharge: 2013-12-10 | Disposition: A | Discharge status: Home / Self Care | Payer: Self-pay | Attending: Emergency Medicine | Admitting: Emergency Medicine

## 2013-12-10 ENCOUNTER — Encounter (HOSPITAL_BASED_OUTPATIENT_CLINIC_OR_DEPARTMENT_OTHER): Payer: Self-pay | Admitting: Emergency Medicine

## 2013-12-10 DIAGNOSIS — Z791 Long term (current) use of non-steroidal anti-inflammatories (NSAID): Secondary | ICD-10-CM | POA: Insufficient documentation

## 2013-12-10 DIAGNOSIS — G4700 Insomnia, unspecified: Secondary | ICD-10-CM | POA: Insufficient documentation

## 2013-12-10 DIAGNOSIS — R000 Tachycardia, unspecified: Secondary | ICD-10-CM | POA: Insufficient documentation

## 2013-12-10 DIAGNOSIS — F411 Generalized anxiety disorder: Secondary | ICD-10-CM | POA: Insufficient documentation

## 2013-12-10 DIAGNOSIS — F329 Major depressive disorder, single episode, unspecified: Secondary | ICD-10-CM

## 2013-12-10 DIAGNOSIS — F32A Depression, unspecified: Secondary | ICD-10-CM

## 2013-12-10 DIAGNOSIS — F3130 Bipolar disorder, current episode depressed, mild or moderate severity, unspecified: Secondary | ICD-10-CM | POA: Insufficient documentation

## 2013-12-10 DIAGNOSIS — Z79899 Other long term (current) drug therapy: Secondary | ICD-10-CM | POA: Insufficient documentation

## 2013-12-10 HISTORY — DX: Depression, unspecified: F32.A

## 2013-12-10 HISTORY — DX: Major depressive disorder, single episode, unspecified: F32.9

## 2013-12-10 MED ORDER — LORAZEPAM 1 MG PO TABS
1.0000 mg | ORAL_TABLET | Freq: Once | ORAL | Status: AC
  Administered 2013-12-10: 1 mg via ORAL
  Filled 2013-12-10: qty 1

## 2013-12-10 MED ORDER — ZOLPIDEM TARTRATE 5 MG PO TABS: 5.0000 mg | ORAL_TABLET | Freq: Every evening | ORAL | Status: DC | PRN

## 2013-12-10 NOTE — ED Provider Notes (Addendum)
CSN: [id number]     Arrival date & time 12/10/13  0756 History   First MD Initiated Contact with Patient 12/10/13 [id number]     Chief Complaint  Patient presents with  . Insomnia     (Consider location/radiation/quality/duration/timing/severity/associated sxs/prior Treatment) HPI  This is a 33 year old male with history of depression who presents with insomnia. Patient reports a 2 week history of inability to sleep. Wife is at the bedside. She states that approximate 4 months ago he was diagnosed with bipolar disorder and placed on risperidone. He has had adverse effects to that and was taken off the medication. He does not have followup with his mental health professional until October 5.  Wife states that for the last 2 weeks he has been irritable sleep. They tried melatonin and Benadryl without benefit. Wife denies any manic behaviors at home mostly depressive behaviors. Patient denies any SI or HI. He also has had recent stressor in that he and his wife are separating.  Denies any physical complaints at this time.  Past Medical History  Diagnosis Date  . Depression    History reviewed. No pertinent past surgical history. No family history on file. History  Substance Use Topics  . Smoking status: Never Smoker   . Smokeless tobacco: Not on file  . Alcohol Use: Yes     Comment: occ    Review of Systems  Constitutional: Negative.  Negative for fever.  Respiratory: Negative.  Negative for chest tightness and shortness of breath.   Cardiovascular: Negative.  Negative for chest pain.  Gastrointestinal: Negative.  Negative for abdominal pain.  Genitourinary: Negative.  Negative for dysuria.  Psychiatric/Behavioral: Positive for sleep disturbance. Negative for agitation. The patient is nervous/anxious.   All other systems reviewed and are negative.     Allergies  Review of patient's allergies indicates no known allergies.  Home Medications   Prior to Admission medications    Medication Sig Start Date End Date Taking? Authorizing Provider  albuterol (PROVENTIL HFA;VENTOLIN HFA) 108 (90 BASE) MCG/ACT inhaler Inhale 1-2 puffs into the lungs every 6 (six) hours as needed for wheezing. 01/04/13   April K Palumbo-Rasch, MD  benztropine (COGENTIN) 1 MG tablet Take 1 mg by mouth 2 (two) times daily.    Historical Provider, MD  HYDROcodone-acetaminophen (NORCO/VICODIN) 5-325 MG per tablet Take 2 tablets by mouth every 4 (four) hours as needed. 03/04/13   Elson Areas, PA-C  ibuprofen (ADVIL,MOTRIN) 600 MG tablet Take 1 tablet (600 mg total) by mouth every 6 (six) hours as needed. 05/08/13   Shon Baton, MD  loratadine (CLARITIN) 10 MG tablet Take 1 tablet (10 mg total) by mouth daily. 01/04/13   April K Palumbo-Rasch, MD  naproxen (NAPROSYN) 500 MG tablet Take 1 tablet (500 mg total) by mouth 2 (two) times daily with a meal. 11/06/12   Emilia Beck, PA-C  zolpidem (AMBIEN) 5 MG tablet Take 1 tablet (5 mg total) by mouth at bedtime as needed for sleep. 12/10/13   Shon Baton, MD   BP 154/103  Pulse 119  Temp(Src) 99.4 F (37.4 C) (Oral)  Resp 18  Ht  (1.549 m)  Wt 164 lb (74.39 kg)  BMI 31.00 kg/m2  SpO2 100% Physical Exam  Nursing note and vitals reviewed. Constitutional: He is oriented to person, place, and time. He appears well-developed and well-nourished. No distress.  HENT:  Head: Normocephalic and atraumatic.  Eyes: Pupils are equal, round, and reactive to light.  Neck: Neck supple.  Cardiovascular: Regular rhythm and normal heart sounds.   tachycardia  Pulmonary/Chest: Effort normal. No respiratory distress.  Abdominal: Soft. There is no tenderness.  Musculoskeletal: He exhibits no edema.  Lymphadenopathy:    He has no cervical adenopathy.  Neurological: He is alert and oriented to person, place, and time.  Skin: Skin is warm and dry.  Psychiatric:  Flat affect    ED Course  Procedures (including critical care time) Labs  Review Labs Reviewed - No data to display  Imaging Review No results found.   EKG Interpretation   Date/Time:  Saturday December 10 2013 08:30:52 EDT Ventricular Rate:  114 PR Interval:  114 QRS Duration: 84 QT Interval:  336 QTC Calculation: 463 R Axis:   80 Text Interpretation:  Sinus tachycardia Inferior infarct , age  undetermined Abnormal ECG Inferior twave inversions unchanged from prior  Confirmed by HORTON  MD, COURTNEY ([id number]) on 12/10/2013 9:05:27 AM      MDM   Final diagnoses:  Insomnia  Depression   Patient presents with insomnia. He has been off his risperidone because of side effects and has been unable to sleep for the last 2 weeks. His anxious appearing on exam but otherwise nontoxic. Heart rate noted to be 119. Suspect anxiety is a component. EKG is largely unchanged from prior. Patient was given Ativan with some improvement of his symptoms. He has a Financial trader as an outpatient he can followup with. Discussed with patient and his wife initiation of Ambien to see if this helps with his sleep. He is to call his doctor on Monday for recheck and update of his symptoms.  Does not appear to be acutely manic.  After history, exam, and medical workup I feel the patient has been appropriately medically screened and is safe for discharge home. Pertinent diagnoses were discussed with the patient. Patient was given return precautions.    Shon Baton, MD 12/10/13 [phone]  Repeat HR 105 after ativan.  Shon Baton, MD 12/10/13 [phone]

## 2013-12-10 NOTE — ED Notes (Signed)
Unable to sleep for the past two weeks, recently separated from wife

## 2013-12-10 NOTE — Discharge Instructions (Signed)
You need to follow-up with your mental health professional if your symptoms and insomnia persist.  Insomnio (Insomnia) El insomnio es un trastorno frecuente en la capacidad para dormirse o para Personal assistant dormido. Puede ser un problema crnico o un trastorno del momento. En ambos casos es un problema frecuente. Puede tratarse de un problema del momento cuando se relaciona con alguna situacin de estrs o preocupacin. Es un trastorno crnico cuando se relaciona con situaciones de Armed forces operational officer las horas de vigilia o con malos hbitos de sueo. Con el tiempo, la privacin del sueo en s misma, puede hacer que el problema empeore. Las cosas ms pequeas se agravan debido al cansancio y a que la capacidad para enfrentarlas disminuye. CAUSAS  Estrs, ansiedad y depresin.  Malos hbitos para dormir.  Distracciones como mirar TV en la cama.  Siestas en horarios prximos a la hora de ir a dormir.  Involucrarse en conversaciones de gran carga emocional antes de ir a dormir.  Leer textos tcnicos antes de dormir.  Consumir alcohol y otros sedantes. Ellos pueden empeorar el problema. Pueden modificar los patrones de sueo normales y la normal [place].  Consumir estimulantes como cafena algunas horas antes de ir a dormir.  Sndromes dolorosos y las dificultades respiratorias pueden causar insomnio.  Realizar ejercicios a ltima hora de la noche.  El cambio en las zonas horarias puede causar trastornos del sueo (jet lag). En algunos casos se recomienda que otra persona observe sus patrones de sueo. Deben observar los perodos en los que no respira durante la noche (apnea del sueo). Tambin deben observar cunto tiempo duran esos perodos. Si vive slo o no tiene una persona de confianza que lo observe, podr concurrir a una clnica del sueo, en la que lo controlarn de [place] profesional. La apnea del sueo requiere controles y TEFL teacher. Entregue su historia clnica al  profesional que lo asiste Tambin infrmele las observaciones que sus familiares hayan hecho con respecto a sus hbitos de sueo.  SNTOMAS  Sentir por la maana que no ha descansado lo suficiente.  Ansiedad y agitacin a la hora de dormir  Dificultad para dormirse o para Arts administrator sueo. TRATAMIENTO  El [employer] indicar un tratamiento para los trastornos subyacentes. Tambin podr aconsejarlo o ayudarlo si usted toma alcohol o se automedica con otras drogas. El tratamiento de los problemas subyacentes generalmente eliminarn los problemas de insomnio.  Podrn prescribirle medicamentos para usar durante un plazo breve. Generalmente no se recomiendan para un uso prolongado.  Generalmente no se recomiendan los medicamentos de venta libre para un uso prolongado. Podran causarle adiccin.  Puede hacer ms fcil el conciliar el sueo si realiza modificaciones en su estilo de vida tales como:  Utilizar tcnicas de relajacin que favorecen la respiracin y reducen la tensin muscular.  No practicar actividad fsica en las ltimas horas del da.  Modificar la dieta y el horario de la ltima comida. No tomar colaciones durante la noche  Trate de establecer una hora habitual para irse a dormir.  La psicoterapia puede ser de utilidad para los problemas que le ocasionan estrs y preocupaciones.  Si hay un ambiente ruidoso y no Advertising account executive, puede ser til Optometrist suave o sonidos agradables.  Suspenda los trabajos tediosos y [employer] al menos una hora antes de ir a dormir. INSTRUCCIONES PARA EL CUIDADO DOMICILIARIO  Lleve un diario. Infrmele al profesional que lo asiste sus progresos. Esto incluye todos los efectos secundarios de los medicamentos. Concurra regularmente a la consulta con el  profesional [place] nota de:  La hora en que se duerme.  Las horas en las que permanece despierto durante la noche.  La calidad del sueo.  Cmo se siente al da siguiente. Esta  informacin ayudar a que Biochemist, clinical.   Levntese de la cama si permanece despierto por ms de 15 minutos. Lea o realice alguna actividad tranquila. Mantenga las luces bajas. Espere hasta que sienta sueo y luego vuelva a la cama.  Mantenga un ritmo constante de vigilia y sueo. Evite las siestas.  Practique actividad fsica con regularidad.  Evite las distracciones en el momento de ir a dormir. Entre las distracciones se Production assistant, radio ver televisin o Education officer, environmental alguna actividad intensa o Christiana, hacer las cuentas de los gastos domsticos.  Programe un ritual para irse a dormir. Mantenga una rutina familiar relacionada con el bao diario, el cepillado de los [place], irse a la cama todas las noches a la misma hora, Tax adviser [place]. La rutina aumenta el xito de conciliar el sueo ms rpido.  Use tcnicas de relajacin. Practique rutinas que favorezcan la respiracin y alivien la tensin muscular. Tambin puede ayudarlo la visualizacin de escenas pacficas. Tambin puede tratar de [employer] pensamientos problemticos o molestos si mantiene la mente ocupada con pensamientos repetitivos o aburridos, como el antiguo consejo de Multimedia programmer. Tambin puede ser ms creativo, e imaginar que planta hermosas flores en su jardn, una detrs de la otra.  Durante el da, trabaje para Chief Strategy Officer. Cuando no es posible, algunas de las sugerencias ya presentadas lo ayudarn a reducir la ansiedad que acompaa las situaciones de estrs. EST SEGURO QUE:   Comprende las instrucciones para el alta mdica.  Controlar su enfermedad.  Solicitar atencin mdica de inmediato segn las indicaciones. Document Released: 03/03/2005 Document Revised: 05/26/2011 [facility] Patient Information 2015 [place], [place]. This information is not intended to replace advice given to you by your health care provider. Make sure you discuss any questions you have with your health care  provider.

## 2014-09-20 ENCOUNTER — Emergency Department (HOSPITAL_BASED_OUTPATIENT_CLINIC_OR_DEPARTMENT_OTHER)
Admission: EM | Admit: 2014-09-20 | Discharge: 2014-09-20 | Disposition: A | Discharge status: Home / Self Care | Payer: Self-pay | Attending: Emergency Medicine | Admitting: Emergency Medicine

## 2014-09-20 ENCOUNTER — Encounter (HOSPITAL_BASED_OUTPATIENT_CLINIC_OR_DEPARTMENT_OTHER): Payer: Self-pay | Admitting: Emergency Medicine

## 2014-09-20 DIAGNOSIS — Z8659 Personal history of other mental and behavioral disorders: Secondary | ICD-10-CM | POA: Insufficient documentation

## 2014-09-20 DIAGNOSIS — G249 Dystonia, unspecified: Secondary | ICD-10-CM

## 2014-09-20 DIAGNOSIS — M545 Low back pain: Secondary | ICD-10-CM | POA: Insufficient documentation

## 2014-09-20 DIAGNOSIS — Z791 Long term (current) use of non-steroidal anti-inflammatories (NSAID): Secondary | ICD-10-CM | POA: Insufficient documentation

## 2014-09-20 DIAGNOSIS — M791 Myalgia, unspecified site: Secondary | ICD-10-CM

## 2014-09-20 DIAGNOSIS — Z720 Tobacco use: Secondary | ICD-10-CM | POA: Insufficient documentation

## 2014-09-20 DIAGNOSIS — R079 Chest pain, unspecified: Secondary | ICD-10-CM | POA: Insufficient documentation

## 2014-09-20 DIAGNOSIS — Z79899 Other long term (current) drug therapy: Secondary | ICD-10-CM | POA: Insufficient documentation

## 2014-09-20 HISTORY — DX: Bipolar disorder, unspecified: F31.9

## 2014-09-20 LAB — CBC WITH DIFFERENTIAL/PLATELET
BASOS PCT: 0 % (ref 0–1)
Basophils Absolute: 0 10*3/uL (ref 0.0–0.1)
EOS ABS: 0.3 10*3/uL (ref 0.0–0.7)
Eosinophils Relative: 3 % (ref 0–5)
HCT: 47.6 % (ref 39.0–52.0)
Hemoglobin: 16.4 g/dL (ref 13.0–17.0)
LYMPHS ABS: 1.9 10*3/uL (ref 0.7–4.0)
Lymphocytes Relative: 22 % (ref 12–46)
MCH: 29.8 pg (ref 26.0–34.0)
MCHC: 34.5 g/dL (ref 30.0–36.0)
MCV: 86.5 fL (ref 78.0–100.0)
MONO ABS: 0.6 10*3/uL (ref 0.1–1.0)
MONOS PCT: 7 % (ref 3–12)
NEUTROS ABS: 5.9 10*3/uL (ref 1.7–7.7)
Neutrophils Relative %: 68 % (ref 43–77)
Platelets: 223 10*3/uL (ref 150–400)
RBC: 5.5 MIL/uL (ref 4.22–5.81)
RDW: 13.2 % (ref 11.5–15.5)
WBC: 8.7 10*3/uL (ref 4.0–10.5)

## 2014-09-20 LAB — BASIC METABOLIC PANEL
ANION GAP: 12 (ref 5–15)
BUN: 14 mg/dL (ref 6–20)
CALCIUM: 9.1 mg/dL (ref 8.9–10.3)
CO2: 22 mmol/L (ref 22–32)
Chloride: 104 mmol/L (ref 101–111)
Creatinine, Ser: 0.83 mg/dL (ref 0.61–1.24)
GFR calc Af Amer: >60 mL/min (ref >60)
Glucose, Bld: 122 mg/dL — ABNORMAL HIGH (ref 65–99)
POTASSIUM: 3.3 mmol/L — AB (ref 3.5–5.1)
SODIUM: 138 mmol/L (ref 135–145)

## 2014-09-20 LAB — CK: CK TOTAL: 42 U/L — AB (ref 49–397)

## 2014-09-20 MED ORDER — BENZTROPINE MESYLATE 1 MG/ML IJ SOLN
1.0000 mg | Freq: Once | INTRAMUSCULAR | Status: AC
  Administered 2014-09-20: 1 mg via INTRAMUSCULAR
  Filled 2014-09-20: qty 2

## 2014-09-20 MED ORDER — BENZTROPINE MESYLATE 1 MG PO TABS: 1.0000 mg | ORAL_TABLET | Freq: Every evening | ORAL | Status: DC | PRN

## 2014-09-20 MED ORDER — CYCLOBENZAPRINE HCL 10 MG PO TABS: 10.0000 mg | ORAL_TABLET | Freq: Three times a day (TID) | ORAL | Status: DC | PRN

## 2014-09-20 MED ORDER — CYCLOBENZAPRINE HCL 10 MG PO TABS
10.0000 mg | ORAL_TABLET | Freq: Once | ORAL | Status: AC
  Administered 2014-09-20: 10 mg via ORAL
  Filled 2014-09-20: qty 1

## 2014-09-20 NOTE — Discharge Instructions (Signed)
Distonas (Dystonias) Las distonas son desrdenes de la motricidad en los cuales fuertes contracciones musculares ocasionan torceduras y movimientos repetitivos o posturas anormales. Los movimientos, que son involuntarios y a Financial controller, Futures trader a un solo [employer] o a un grupo de [employer] de [place], piernas, cuello o todo el cuerpo. Los primeros sntomas (problemas) pueden incluir un deterioro al escribir despus de varias lneas, calambres en los pies o una tendencia del pie a levantarse o arrastrarse luego de caminar una cierta distancia. Otros sntomas posibles son temblores y Radio producer en la voz o el habla. Las lesiones de nacimiento (particularmente debidas a la falta de oxgeno), ciertas infecciones, reacciones a ciertas drogas, metales pesados o envenenamiento por monxido de carbono, traumas (dao por accidente) o ictus pueden causar sntomas distnicos. Alrededor de la mitad de los casos de distona no tienen conexin con enfermedades o lesiones y son llamadas distona primaria o idioptica. De las distonas primarias, muchas parecen ser heredadas de forma dominante. Las distonias tambin pueden ser sntomas de otras enfermedades, algunas de las cuales pueden ser hereditarias (adquirirlas de los [place]). En algunos individuos, los sntomas de distona aparecen espontneamente en la [place] 5 y los 16 aos de edad, normalmente en el pie o la [place]. Para otras personas, los sntomas aparecen en los ltimos aos de la adolescencia o los primeros de la [place]. TRATAMIENTO No se ha descubierto un tratamiento universalmente efectivo para las distonas. En su lugar, los mdicos utilizan una gran cantidad de terapias ([place], [place] y otros tratamientos como terapia fsica, entablillado, control del estrs y biorretroalimentacin), orientados a reducir o Pharmacologist los espasmos musculares y Chief Technology Officer. Debido a que las respuestas a las drogas varan entre pacientes e incluso  con la misma persona a travs del tiempo, la terapia debe individualizarse. PRONSTICO Los sntomas iniciales pueden ser muy leves y pueden aparecer slo con el esfuerzo prolongado, estrs o fatiga. Durante un periodo de [place], los sntomas pueden volverse ms evidentes y expandirse y ser Production designer, theatre/television/film; a veces, sin embargo hay poca o ninguna progresin. TRABAJOS DE INVESTIGACIN Los investigadores creen que las distonas resultan de una anormalidad en la zona del cerebro llamada ganglio basal, en donde se procesan algunos de los mensajes que inician las contracciones musculares. Los cientficos creen que se trata de un defecto en la capacidad del cuerpo para procesar un grupo de qumicos neurotransmisores que ayudan a que las clulas del cerebro se comuniquen entre si. Los cientficos de los laboratorios NINDS que llevado a cabo investigaciones detalladas de los patrones de las actividades musculares en personas con distonas. Los estudios que utilizan anlisis de EEG y neuroimagenes, prueban la actividad cerebral. Las investigacin del gen o genes responsables de algunas formas de distonas dominantes contina. En 1989, un grupo de investigadores traz un grfico de genes de torsin de distona de aparicin temprana al cromosoma 9; el gen luego fue denominado DYT1. En 1997, el equipo secuenci el gen DYT1 y descubri que est codificado de una protena previa desconocida hoy denominada "torsina A". Document Released: 06/19/2008 Document Revised: 05/26/2011 [facility] Patient Information 2015 [place], [place]. This information is not intended to replace advice given to you by your health care provider. Make sure you discuss any questions you have with your health care provider.  Dolor muscular (Muscle Pain) Las causas del dolor muscular (mialgia) pueden ser [place], entre ellas:  Uso excesivo del msculo o distensin muscular, en especial si la persona no est en buen estado fsico. Esta es la  causa ms comn  del dolor muscular.  Lesiones.  [place].  Virus, como el de la gripe.  Enfermedades infecciosas.  Fibromialgia, que es una afeccin crnica que causa dolor de Turkmenistancabeza, fatiga y dolor muscular con la palpacin.  Enfermedades autoinmunes, como el lupus.  Determinados medicamentos, como los inhibidores de la ECA y las estatinas. El dolor muscular puede ser leve o intenso. La mayora de las veces, el dolor dura solo un corto perodo y desaparece sin tratamiento. Para diagnosticar la causa del dolor muscular, el mdico le preguntar los antecedentes mdicos. Esto significa que le preguntar cundo comenz Psychiatric nurseel dolor muscular y qu ha sucedido desde ese momento. Si el dolor muscular no comenz hace mucho tiempo, el mdico puede esperar antes de hacer diversas pruebas. Si el dolor muscular comenz hace mucho tiempo, el mdico puede decidir que es ms conveniente hacer pruebas de inmediato. Si el mdico cree que Psychiatric nurseel dolor muscular puede estar causado por una enfermedad, es posible que necesite hacer pruebas adicionales para descartar determinadas afecciones.  El tratamiento del dolor muscular depende de la causa. El cuidado en el hogar a menudo es suficiente para Photographeraliviar el dolor muscular. El mdico tambin puede recetarle un medicamento antinflamatorio. INSTRUCCIONES PARA EL CUIDADO EN EL HOGAR Controle su afeccin para ver si hay cambios. Las siguientes indicaciones ayudarn a Architectural technologistaliviar cualquier molestia que pueda sentir:  CenterPoint Energyome los medicamentos de venta libre o recetados solamente segn las indicaciones del mdico.  Aplique hielo en el msculo dolorido:  Ponga el hielo en una bolsa plstica.  Colquese una toalla entre la piel y la bolsa de hielo.  Aplique el hielo de 3 a 4 veces por da durante 15 a 20minutos.  Puede alternar la colocacin de compresas calientes y fras en el msculo segn lo que le haya indicado el mdico.  Si el uso excesivo del msculo le est causando dolor muscular,  disminuya las actividades hasta que el dolor desaparezca.  Recuerde que es normal sentir algo de dolor muscular despus de comenzar un programa de entrenamiento. Generalmente duelen aquellos msculos que no se utilizan con frecuencia.  Si usted no hace actividad fsica con frecuencia, los ejercicios deben ser suaves y regulares.  Para reducir el riesgo de dolor muscular, haga un precalentamiento antes de la actividad fsica.  No siga haciendo actividad fsica si el dolor es muy intenso. Este tipo de dolor podra indicar que se ha lesionado un msculo. SOLICITE ATENCIN MDICA SI:  El Sport and exercise psychologistdolor muscular empeora y los medicamentos no surten Milbankefecto.  Tiene dolor muscular que dura ms de 3das.  Tiene una erupcin cutnea o fiebre junto con el dolor muscular.  Tiene dolor muscular despus de una picadura de garrapata.  Tiene dolor muscular mientras hace actividad fsica, aunque est en buen estado fsico.  Tiene enrojecimiento, sensibilidad o hinchazn junto con el dolor muscular.  Tiene dolor muscular despus de comenzar un medicamento nuevo o de cambiar la dosis de un medicamento. SOLICITE ATENCIN MDICA DE INMEDIATO SI:  Tiene dificultad para respirar.  Presenta dificultad para tragar.  Tiene dolor muscular junto con rigidez en el cuello, fiebre y vmitos.  Tiene debilidad muscular intensa o no puede mover una parte del cuerpo. ASEGRESE DE QUE:   Comprende estas instrucciones.  Controlar su afeccin.  Recibir ayuda de inmediato si no mejora o si empeora. Document Released: 06/10/2007 Document Revised: 03/08/2013 [facility] Patient Information 2015 [place], [place]. This information is not intended to replace advice given to you by your health care provider. Make sure  you discuss any questions you have with your health care provider.

## 2014-09-20 NOTE — ED Provider Notes (Signed)
CSN: [id number]     Arrival date & time 09/20/14  2009 History  This chart was scribed for Rolland Porter, MD by Chestine Spore, ED Scribe. The patient was seen in room MH04/MH04 at 8:29 PM.     Chief Complaint  Patient presents with  . Generalized Body Aches      The history is provided by the patient. No language interpreter was used.    HPI Comments: Jay Davis is a 34 y.o. male who presents to the Emergency Department complaining of generalized body aches onset 2 months worsening last night. Pt has been off his cogentin for 2 months and he was on it for his stiffness and cramps. Pt stopped taking the risperdal because of dystonias started on cogentin and changed to ambilify but symptoms persisted. Pt reports that he does a lot of physical and heavy lifting at work. Pt was informed that the medication was what was causing his cramping symptoms. He states that he is having associated symptoms of back pain and CP. He denies n/v, medication change, SOB, and any other symptoms. Pt is allergic to risperdal and ambien.    Past Medical History  Diagnosis Date  . Depression   . Bipolar 1 disorder    History reviewed. No pertinent past surgical history. No family history on file. History  Substance Use Topics  . Smoking status: Current Some Day Smoker    Types: Cigarettes  . Smokeless tobacco: Not on file  . Alcohol Use: Yes     Comment: occ    Review of Systems  Constitutional: Negative for fever, chills, diaphoresis, appetite change and fatigue.  HENT: Negative for mouth sores, sore throat and trouble swallowing.   Eyes: Negative for visual disturbance.  Respiratory: Negative for cough, chest tightness, shortness of breath and wheezing.   Cardiovascular: Positive for chest pain.  Gastrointestinal: Negative for nausea, vomiting, abdominal pain, diarrhea and abdominal distention.  Endocrine: Negative for polydipsia, polyphagia and polyuria.  Genitourinary: Negative for dysuria,  frequency and hematuria.  Musculoskeletal: Positive for back pain. Negative for gait problem.  Skin: Negative for color change, pallor and rash.  Neurological: Negative for dizziness, syncope, light-headedness and headaches.  Hematological: Does not bruise/bleed easily.  Psychiatric/Behavioral: Negative for behavioral problems and confusion.      Allergies  Ambien  Home Medications   Prior to Admission medications   Medication Sig Start Date End Date Taking? Authorizing Provider  ARIPiprazole (ABILIFY) 9.75 MG/1.3ML injection Inject into the muscle once.   Yes Historical Provider, MD  albuterol (PROVENTIL HFA;VENTOLIN HFA) 108 (90 BASE) MCG/ACT inhaler Inhale 1-2 puffs into the lungs every 6 (six) hours as needed for wheezing. 01/04/13   April Palumbo, MD  benztropine (COGENTIN) 1 MG tablet Take 1 tablet (1 mg total) by mouth at bedtime as needed for tremors. 09/20/14   Rolland Porter, MD  cyclobenzaprine (FLEXERIL) 10 MG tablet Take 1 tablet (10 mg total) by mouth 3 (three) times daily as needed for muscle spasms. 09/20/14   Rolland Porter, MD  HYDROcodone-acetaminophen (NORCO/VICODIN) 5-325 MG per tablet Take 2 tablets by mouth every 4 (four) hours as needed. 03/04/13   Elson Areas, PA-C  ibuprofen (ADVIL,MOTRIN) 600 MG tablet Take 1 tablet (600 mg total) by mouth every 6 (six) hours as needed. 05/08/13   Shon Baton, MD  loratadine (CLARITIN) 10 MG tablet Take 1 tablet (10 mg total) by mouth daily. 01/04/13   April Palumbo, MD  naproxen (NAPROSYN) 500 MG tablet Take 1 tablet (500  mg total) by mouth 2 (two) times daily with a meal. 11/06/12   Emilia BeckKaitlyn Szekalski, PA-C  zolpidem (AMBIEN) 5 MG tablet Take 1 tablet (5 mg total) by mouth at bedtime as needed for sleep. 12/10/13   Shon Batonourtney F Horton, MD   BP 129/91 mmHg  Pulse 115  Temp(Src) 98.4 F (36.9 C) (Oral)  Resp 20  Ht 5\' 6"  (1.676 m)  Wt 178 lb (80.74 kg)  BMI 28.74 kg/m2  SpO2 98% Physical Exam  Constitutional: He is oriented to  person, place, and time. He appears well-developed and well-nourished. No distress.  HENT:  Head: Normocephalic.  Eyes: Conjunctivae are normal. Pupils are equal, round, and reactive to light. No scleral icterus.  Neck: Normal range of motion. Neck supple. No thyromegaly present.  Cardiovascular: Normal rate and regular rhythm.  Exam reveals no gallop and no friction rub.   No murmur heard. Pulmonary/Chest: Effort normal and breath sounds normal. No respiratory distress. He has no wheezes. He has no rales.  Abdominal: Soft. Bowel sounds are normal. He exhibits no distension. There is no tenderness. There is no rebound.  Musculoskeletal: Normal range of motion.       Lumbar back: He exhibits tenderness.  Tenderness along low lumbar paraspinous muscles.   Neurological: He is alert and oriented to person, place, and time.  Skin: Skin is warm and dry. No rash noted.  Psychiatric: He has a normal mood and affect. His behavior is normal.    ED Course  Procedures (including critical care time) DIAGNOSTIC STUDIES: Oxygen Saturation is 98% on RA, nl by my interpretation.    COORDINATION OF CARE: 8:33 PM-Discussed treatment plan which includes muscle relaxer, refill cogentin, labs, EKG with pt at bedside and pt agreed to plan.   Labs Review Labs Reviewed  BASIC METABOLIC PANEL - Abnormal; Notable for the following:    Potassium 3.3 (*)    Glucose, Bld 122 (*)    All other components within normal limits  CK - Abnormal; Notable for the following:    Total CK 42 (*)    All other components within normal limits  CBC WITH DIFFERENTIAL/PLATELET    Imaging Review No results found.   EKG Interpretation None      MDM   Final diagnoses:  Muscle pain  Dystonia   No acute amounts. No elevation of CPK. No abnormalities were electrolytes. Plan will be reinstating his Cogentin. Flexeril at night for sleep and for his muscle spasms. No signs of acute dystonic reaction. No confusion. No  rigidity, afebrile.   I personally performed the services described in this documentation, which was scribed in my presence. The recorded information has been reviewed and is accurate.    Rolland PorterMark Laurine Kuyper, MD 09/20/14 2156

## 2014-09-20 NOTE — ED Notes (Addendum)
Pt c/o generalized body aches and cramping with bilateral chest pain since last night. Denies fever at home. States he is out of his cogentin medication.

## 2016-10-07 ENCOUNTER — Emergency Department (HOSPITAL_BASED_OUTPATIENT_CLINIC_OR_DEPARTMENT_OTHER): Payer: Self-pay

## 2016-10-07 ENCOUNTER — Encounter (HOSPITAL_BASED_OUTPATIENT_CLINIC_OR_DEPARTMENT_OTHER): Payer: Self-pay

## 2016-10-07 ENCOUNTER — Emergency Department (HOSPITAL_BASED_OUTPATIENT_CLINIC_OR_DEPARTMENT_OTHER)
Admission: EM | Admit: 2016-10-07 | Discharge: 2016-10-07 | Disposition: A | Discharge status: Home / Self Care | Payer: Self-pay | Attending: Emergency Medicine | Admitting: Emergency Medicine

## 2016-10-07 DIAGNOSIS — X58XXXA Exposure to other specified factors, initial encounter: Secondary | ICD-10-CM | POA: Insufficient documentation

## 2016-10-07 DIAGNOSIS — Y939 Activity, unspecified: Secondary | ICD-10-CM | POA: Insufficient documentation

## 2016-10-07 DIAGNOSIS — Y929 Unspecified place or not applicable: Secondary | ICD-10-CM | POA: Insufficient documentation

## 2016-10-07 DIAGNOSIS — S81842A Puncture wound with foreign body, left lower leg, initial encounter: Secondary | ICD-10-CM | POA: Insufficient documentation

## 2016-10-07 DIAGNOSIS — Z23 Encounter for immunization: Secondary | ICD-10-CM | POA: Insufficient documentation

## 2016-10-07 DIAGNOSIS — F17210 Nicotine dependence, cigarettes, uncomplicated: Secondary | ICD-10-CM | POA: Insufficient documentation

## 2016-10-07 DIAGNOSIS — Z79899 Other long term (current) drug therapy: Secondary | ICD-10-CM | POA: Insufficient documentation

## 2016-10-07 DIAGNOSIS — S80852A Superficial foreign body, left lower leg, initial encounter: Secondary | ICD-10-CM

## 2016-10-07 DIAGNOSIS — Y999 Unspecified external cause status: Secondary | ICD-10-CM | POA: Insufficient documentation

## 2016-10-07 MED ORDER — SULFAMETHOXAZOLE-TRIMETHOPRIM 800-160 MG PO TABS: 1.0000 | ORAL_TABLET | Freq: Two times a day (BID) | ORAL | 0 refills | Status: AC

## 2016-10-07 MED ORDER — IBUPROFEN 800 MG PO TABS: 800.0000 mg | ORAL_TABLET | Freq: Three times a day (TID) | ORAL | 0 refills | Status: DC

## 2016-10-07 MED ORDER — TETANUS-DIPHTH-ACELL PERTUSSIS 5-2.5-18.5 LF-MCG/0.5 IM SUSP
0.5000 mL | Freq: Once | INTRAMUSCULAR | Status: AC
  Administered 2016-10-07: 0.5 mL via INTRAMUSCULAR
  Filled 2016-10-07: qty 0.5

## 2016-10-07 MED ORDER — LIDOCAINE-EPINEPHRINE (PF) 2 %-1:200000 IJ SOLN
20.0000 mL | Freq: Once | INTRAMUSCULAR | Status: AC
  Administered 2016-10-07: 20 mL
  Filled 2016-10-07: qty 20

## 2016-10-07 MED ORDER — CEPHALEXIN 500 MG PO CAPS: 500.0000 mg | ORAL_CAPSULE | Freq: Four times a day (QID) | ORAL | 0 refills | Status: DC

## 2016-10-07 NOTE — ED Notes (Signed)
ED Provider at bedside. 

## 2016-10-07 NOTE — Discharge Instructions (Signed)
Use ibuprofen as needed for pain. You may take us up to 3 times a day. Take this with meals. Do not take other anti-inflammatories that the same time (Advil, Motrin, naproxen, Aleve). You may supplement with Tylenol for any further pain control. Take antibiotics as prescribed. Keep the area clean and dry. Return to the emergency department if you develop fever, chills, increasing pain or redness, or any new or worsening symptoms.

## 2016-10-07 NOTE — ED Triage Notes (Addendum)
C/o wood palette piece in left LE x 1 hour-feels is at least 1+inch splinter in leg-NAD-steady gait

## 2016-10-07 NOTE — ED Notes (Signed)
Patient's wife stated that while they were unloading several pellets from the truck, a pellet's corner hit his leg.  They removed a piece of pellet from the pt's left lower leg.  Patient took 3 Ibuprofen 4 hours ago) prior to arrival with no effect.

## 2016-10-07 NOTE — ED Provider Notes (Signed)
[facility] Provider Note   CSN: [id number] Arrival date & time: 10/07/16  2040  By signing my name below, I, Thelma Barge, attest that this documentation has been prepared under the direction and in the presence of Madinah Quarry, PA-C. Electronically Signed: Thelma Barge, Scribe. 10/07/16. 10:44 PM.  History   Chief Complaint Chief Complaint  Patient presents with  . Foreign Body   The history is provided by the patient. No language interpreter was used.    HPI Comments: Jay Davis is a 36 y.o. male who presents to the Emergency Department complaining of constant, throbbing, rapid-onset right-sided leg pain that occurred about 4 hours ago. 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 He tried to remove the piece of wood, and got part of it out before it snapped off in his leg. He has tried taking ibuprofen with mild relief. Jay walked normally after the accident. Jay denies numbness/tingling of the leg or foot. Jay has no pertinent medical problems. Tetanus not UTD.  Past Medical History:  Diagnosis Date  . Bipolar 1 disorder (HCC)   . Depression     There are no active problems to display for this patient.   History reviewed. No pertinent surgical history.     Home Medications    Prior to Admission medications   Medication Sig Start Date End Date Taking? Authorizing Provider  albuterol (PROVENTIL HFA;VENTOLIN HFA) 108 (90 BASE) MCG/ACT inhaler Inhale 1-2 puffs into the lungs every 6 (six) hours as needed for wheezing. 01/04/13   Palumbo, April, MD  ARIPiprazole (ABILIFY) 9.75 MG/1.3ML injection Inject into the muscle once.    [provider]  cephALEXin (KEFLEX) 500 MG capsule Take 1 capsule (500 mg total) by mouth 4 (four) times daily. 10/07/16   Sienna Stonehocker, PA-C  ibuprofen (ADVIL,MOTRIN) 600 MG tablet Take 1 tablet (600 mg total) by mouth every 6 (six) hours as needed.  05/08/13   Horton, Mayer Masker, MD  ibuprofen (ADVIL,MOTRIN) 800 MG tablet Take 1 tablet (800 mg total) by mouth 3 (three) times daily. 10/07/16   Tavion Senkbeil, PA-C  loratadine (CLARITIN) 10 MG tablet Take 1 tablet (10 mg total) by mouth daily. 01/04/13   Palumbo, April, MD  naproxen (NAPROSYN) 500 MG tablet Take 1 tablet (500 mg total) by mouth 2 (two) times daily with a meal. 11/06/12   Szekalski, Kaitlyn, PA-C  sulfamethoxazole-trimethoprim (BACTRIM DS,SEPTRA DS) 800-160 MG tablet Take 1 tablet by mouth 2 (two) times daily. 10/07/16 10/14/16  Nykole Matos, PA-C    Family History No family history on file.  Social History Social History  Substance Use Topics  . Smoking status: Current Some Day Smoker    Types: Cigarettes  . Smokeless tobacco: Never Used  . Alcohol use Yes     Comment: occ     Allergies   Ambien [zolpidem tartrate] and Pork-derived products   Review of Systems Review of Systems  Skin: Positive for wound.  Neurological: Negative for numbness.  All other systems reviewed and are negative.    Physical Exam Updated Vital Signs BP 113/72 (BP Location: Right Arm)   Pulse 73   Temp 98 F (36.7 C) (Oral)   Resp 16   Ht 5\' 6"  (1.676 m)   Wt 80.7 kg (178 lb)   SpO2 98%   BMI 28.73 kg/m   Physical Exam  Constitutional: He is oriented to person, place,  and time. He appears well-developed and well-nourished.  HENT:  Head: Normocephalic.  Eyes: EOM are normal.  Neck: Normal range of motion.  Pulmonary/Chest: Effort normal.  Abdominal: He exhibits no distension.  Musculoskeletal: Normal range of motion.  Full range of motion of the knee and ankle without pain. Compartment soft. Pulses intact bilaterally. Color and warmth equal bilaterally. Sensation intact bilaterally. Strength equal bilaterally.  Neurological: He is alert and oriented to person, place, and time.  Skin:  TTP of the lateral lower left leg. Minimal (1 mm) lesion with no active bleeding.  No obvious foreign body is felt. Tender area is approximately 6 cm x 6 cm and has mild swelling.  Psychiatric: He has a normal mood and affect.  Nursing note and vitals reviewed.    ED Treatments / Results  DIAGNOSTIC STUDIES: Oxygen Saturation is 100% on RA, normal by my interpretation.    COORDINATION OF CARE: 10:44 PM Discussed treatment plan with Jay at bedside and Jay agreed to plan.  Labs (all labs ordered are listed, but only abnormal results are displayed) Labs Reviewed - No data to display  EKG  EKG Interpretation None       Radiology Dg Tibia/fibula Left  Result Date: 10/07/2016 CLINICAL DATA:  Foreign body in the left lower extremity. Pierced by wooden pallet. EXAM: LEFT TIBIA AND FIBULA - 2 VIEW COMPARISON:  None. FINDINGS: There is no evidence of fracture or other focal bone lesions. A BB marks the sites of wound, no radiopaque foreign body. Mild soft tissue edema. No soft tissue air. IMPRESSION: Soft tissue edema at the site of puncture wound. No radiopaque foreign body. Please note, wood is often not radiopaque/visualized by radiograph. Electronically Signed   By: Rubye Oaks M.D.   On: 10/07/2016 21:28    Procedures .Foreign Body Removal Date/Time: 10/08/2016 12:45 AM Performed by: Alveria Apley Authorized by: Alveria Apley  Consent: Verbal consent obtained. Consent given by: patient Body area: skin General location: lower extremity Location details: left lower leg Anesthesia: local infiltration  Anesthesia: Local Anesthetic: lidocaine 2% with epinephrine Anesthetic total: 16 mL 1 objects recovered. Objects recovered: 1.5 cm long wooden splinter (1-2 mm thick) Post-procedure assessment: foreign body removed Patient tolerance: Patient tolerated the procedure well with no immediate complications Comments: Dr. Clarice Pole performed the foreign object removal under ultrasound guidance.   (including critical care time)  Medications Ordered in  ED Medications  lidocaine-EPINEPHrine (XYLOCAINE W/EPI) 2 %-1:200000 (PF) injection 20 mL (20 mLs Infiltration Given by Other 10/07/16 2300)  Tdap (BOOSTRIX) injection 0.5 mL (0.5 mLs Intramuscular Given 10/07/16 2352)     Initial Impression / Assessment and Plan / ED Course  I have reviewed the triage vital signs and the nursing notes.  Pertinent labs & imaging results that were available during my care of the patient were reviewed by me and considered in my medical decision making (see chart for details).     Patient presenting with tenderness and swelling of the left lower leg after a piece of wood got stuck in his leg. Physical exam shows area of tenderness of the left lateral lower leg without obvious foreign body felt. Patient neurovascularly intact with soft compartments. X-ray negative for foreign body, however would cannot always be seen on x-ray. Discussed case with attending, Dr. Clarice Pole evaluated the patient.   With ultrasound, object was located. Removal performed by Dr. Clarice Pole. Area was covered with a bandage, and aftercare instructions given. Will put patient on antibiotics to prevent infection. Ibuprofen for  pain control. Tdap given. Return precautions given. Patient appears safe for discharge. Patient states he understands and agrees to plan.   Final Clinical Impressions(s) / ED Diagnoses   Final diagnoses:  Foreign body of left lower leg, initial encounter    New Prescriptions Discharge Medication List as of 10/07/2016 11:49 PM    START taking these medications   Details  cephALEXin (KEFLEX) 500 MG capsule Take 1 capsule (500 mg total) by mouth 4 (four) times daily., Starting Tue 10/07/2016, Print    !! ibuprofen (ADVIL,MOTRIN) 800 MG tablet Take 1 tablet (800 mg total) by mouth 3 (three) times daily., Starting Tue 10/07/2016, Print    sulfamethoxazole-trimethoprim (BACTRIM DS,SEPTRA DS) 800-160 MG tablet Take 1 tablet by mouth 2 (two) times daily., Starting Tue  10/07/2016, Until Tue 10/14/2016, Print     !! - Potential duplicate medications found. Please discuss with provider.    I personally performed the services described in this documentation, which was scribed in my presence. The recorded information has been reviewed and is accurate.    Alveria ApleyCaccavale, Ahtziri Jeffries, PA-C 10/08/16 0050    Arby BarrettePfeiffer, Marcy, MD 10/13/16 1251    Arby BarrettePfeiffer, Marcy, MD 11/24/16 1423    Arby BarrettePfeiffer, Marcy, MD 11/24/16 1425

## 2017-09-22 ENCOUNTER — Emergency Department (HOSPITAL_BASED_OUTPATIENT_CLINIC_OR_DEPARTMENT_OTHER): Payer: Self-pay

## 2017-09-22 ENCOUNTER — Other Ambulatory Visit: Payer: Self-pay

## 2017-09-22 ENCOUNTER — Encounter (HOSPITAL_BASED_OUTPATIENT_CLINIC_OR_DEPARTMENT_OTHER): Payer: Self-pay | Admitting: Emergency Medicine

## 2017-09-22 ENCOUNTER — Emergency Department (HOSPITAL_BASED_OUTPATIENT_CLINIC_OR_DEPARTMENT_OTHER)
Admission: EM | Admit: 2017-09-22 | Discharge: 2017-09-22 | Disposition: A | Discharge status: Home / Self Care | Payer: Self-pay | Attending: Emergency Medicine | Admitting: Emergency Medicine

## 2017-09-22 DIAGNOSIS — Z79899 Other long term (current) drug therapy: Secondary | ICD-10-CM | POA: Insufficient documentation

## 2017-09-22 DIAGNOSIS — M79675 Pain in left toe(s): Secondary | ICD-10-CM | POA: Insufficient documentation

## 2017-09-22 DIAGNOSIS — M79676 Pain in unspecified toe(s): Secondary | ICD-10-CM

## 2017-09-22 DIAGNOSIS — F17210 Nicotine dependence, cigarettes, uncomplicated: Secondary | ICD-10-CM | POA: Insufficient documentation

## 2017-09-22 MED ORDER — HYDROCODONE-ACETAMINOPHEN 5-325 MG PO TABS: 1.0000 | ORAL_TABLET | Freq: Four times a day (QID) | ORAL | 0 refills | Status: DC | PRN

## 2017-09-22 MED ORDER — NAPROXEN 250 MG PO TABS
500.0000 mg | ORAL_TABLET | Freq: Once | ORAL | Status: AC
  Administered 2017-09-22: 500 mg via ORAL
  Filled 2017-09-22: qty 2

## 2017-09-22 MED ORDER — NAPROXEN 500 MG PO TABS: ORAL_TABLET | ORAL | 0 refills | Status: DC

## 2017-09-22 NOTE — ED Triage Notes (Signed)
Pt is c/o pain to his 5th toe on his left foot  Pt states it hurts to put pressure on it and it hurts to touch it  Denies injury

## 2017-09-22 NOTE — ED Provider Notes (Addendum)
[facility] Provider Note: Lowella DellJ. Lane Linnie Delgrande, MD, FACEP  CSN: [id number] MRN: [id number] ARRIVAL: 09/22/17 at 0120 ROOM: MH08/MH08   CHIEF COMPLAINT  Toe Pain   HISTORY OF PRESENT ILLNESS  09/22/17 4:22 AM Dorna Maiestor Tien is a 37 y.o. male with a 3-day history of pain in his left fifth toe.  The onset has been gradual and the pain has become severe, especially with movement or weightbearing.  There is also swelling of that toe but no erythema or warmth.  He is not aware of injuring that toe.  [facility] [place] [facility] reveals the patient has received no opioids in the past 2 years.   Past Medical History:  Diagnosis Date  . Bipolar 1 disorder (HCC)   . Depression     History reviewed. No pertinent surgical history.  History reviewed. No pertinent family history.  Social History   Tobacco Use  . Smoking status: Current Some Day Smoker    Types: Cigarettes  . Smokeless tobacco: Never Used  Substance Use Topics  . Alcohol use: Yes    Comment: occ  . Drug use: No    Prior to Admission medications   Medication Sig Start Date End Date Taking? Authorizing Provider  albuterol (PROVENTIL HFA;VENTOLIN HFA) 108 (90 BASE) MCG/ACT inhaler Inhale 1-2 puffs into the lungs every 6 (six) hours as needed for wheezing. 01/04/13   Palumbo, April, MD  ARIPiprazole (ABILIFY) 9.75 MG/1.3ML injection Inject into the muscle once.    [provider]  cephALEXin (KEFLEX) 500 MG capsule Take 1 capsule (500 mg total) by mouth 4 (four) times daily. 10/07/16   Caccavale, Sophia, PA-C  ibuprofen (ADVIL,MOTRIN) 600 MG tablet Take 1 tablet (600 mg total) by mouth every 6 (six) hours as needed. 05/08/13   Horton, Mayer Maskerourtney F, MD  ibuprofen (ADVIL,MOTRIN) 800 MG tablet Take 1 tablet (800 mg total) by mouth 3 (three) times daily. 10/07/16   Caccavale, Sophia, PA-C  loratadine (CLARITIN) 10 MG tablet Take 1 tablet (10 mg total) by mouth daily.  01/04/13   Palumbo, April, MD  naproxen (NAPROSYN) 500 MG tablet Take 1 tablet (500 mg total) by mouth 2 (two) times daily with a meal. 11/06/12   Szekalski, [place]ait[place], PA-C    Allergies Ambien [zolpidem tartrate] and Pork-derived products   REVIEW OF SYSTEMS  Negative except as noted here or in the History of Present Illness.   PHYSICAL EXAMINATION  Initial Vital Signs Blood pressure 95/61, pulse 76, temperature 98.1 F (36.7 C), temperature source Oral, resp. rate 20, height 5\' 6"  (1.676 m), weight 79.8 kg (176 lb), SpO2 99 %.  Examination General: Well-developed, well-nourished male in no acute distress; appearance consistent with age of record HENT: normocephalic; atraumatic Eyes: Normal appearance Neck: supple  Heart: regular rate and rhythm Lungs: clear to auscultation bilaterally Abdomen: soft; nondistended; nontender; bowel sounds present Extremities: No deformity; tenderness and swelling of the left fifth toe without erythema or warmth, brisk capillary refill Neurologic: Sleeping but readily awakened; motor function intact in all extremities and symmetric; no facial droop Skin: Warm and dry   RESULTS  Summary of this visit's results, reviewed by myself:   EKG Interpretation  Date/Time:    Ventricular Rate:    PR Interval:    QRS Duration:   QT Interval:    QTC Calculation:   R Axis:     Text Interpretation:        Laboratory Studies: No results found for this or any  previous visit (from the past 24 hour(s)). Imaging Studies: Dg Toe 5th Left  Result Date: 09/22/2017 CLINICAL DATA:  Left fifth toe pain for 2 days.  No known injury. EXAM: DG TOE 5TH LEFT COMPARISON:  None. FINDINGS: There is no evidence of fracture or dislocation. There is no evidence of arthropathy or other focal bone abnormality. Soft tissues are unremarkable. IMPRESSION: Negative radiographs of the left fifth toe. Electronically Signed   By: Rubye Oaks M.D.   On: 09/22/2017 05:49    ED  COURSE and MDM  Nursing notes and initial vitals signs, including pulse oximetry, reviewed.  Vitals:   09/22/17 0134 09/22/17 0315 09/22/17 0526  BP: 109/76 95/61 120/89  Pulse: (!) 103 76 90  Resp: 18 20 20   Temp: 98.1 F (36.7 C)    TempSrc: Oral    SpO2: 97% 99% 98%  Weight: 79.8 kg (176 lb)    Height: 5\' 6"  (1.676 m)     The cause of the patient's toe pain is not clear at this time.  It does not appear infected.  There is no evidence of bony trauma on x-ray.  It is an atypical location for gout.  We will treat him symptomatically and refer to sports medicine.  PROCEDURES    ED DIAGNOSES     ICD-10-CM   1. Pain of fifth toe M79.676        Paula Libra, MD 09/22/17 0555    Paula Libra, MD 09/22/17 [phone]

## 2018-07-28 ENCOUNTER — Other Ambulatory Visit: Payer: Self-pay

## 2018-07-28 ENCOUNTER — Inpatient Hospital Stay (HOSPITAL_BASED_OUTPATIENT_CLINIC_OR_DEPARTMENT_OTHER)
Admission: EM | Admit: 2018-07-28 | Discharge: 2018-08-07 | DRG: 872 | Disposition: A | Discharge status: Home Health | Payer: Self-pay | Attending: Internal Medicine | Admitting: Internal Medicine

## 2018-07-28 ENCOUNTER — Emergency Department (HOSPITAL_BASED_OUTPATIENT_CLINIC_OR_DEPARTMENT_OTHER): Payer: Self-pay

## 2018-07-28 ENCOUNTER — Encounter (HOSPITAL_BASED_OUTPATIENT_CLINIC_OR_DEPARTMENT_OTHER): Payer: Self-pay | Admitting: *Deleted

## 2018-07-28 DIAGNOSIS — Z87891 Personal history of nicotine dependence: Secondary | ICD-10-CM

## 2018-07-28 DIAGNOSIS — Z1159 Encounter for screening for other viral diseases: Secondary | ICD-10-CM

## 2018-07-28 DIAGNOSIS — Z79899 Other long term (current) drug therapy: Secondary | ICD-10-CM

## 2018-07-28 DIAGNOSIS — J45909 Unspecified asthma, uncomplicated: Secondary | ICD-10-CM | POA: Diagnosis present

## 2018-07-28 DIAGNOSIS — K219 Gastro-esophageal reflux disease without esophagitis: Secondary | ICD-10-CM | POA: Diagnosis present

## 2018-07-28 DIAGNOSIS — K56600 Partial intestinal obstruction, unspecified as to cause: Secondary | ICD-10-CM | POA: Diagnosis present

## 2018-07-28 DIAGNOSIS — J4520 Mild intermittent asthma, uncomplicated: Secondary | ICD-10-CM

## 2018-07-28 DIAGNOSIS — E876 Hypokalemia: Secondary | ICD-10-CM | POA: Diagnosis present

## 2018-07-28 DIAGNOSIS — L0291 Cutaneous abscess, unspecified: Secondary | ICD-10-CM

## 2018-07-28 DIAGNOSIS — F319 Bipolar disorder, unspecified: Secondary | ICD-10-CM | POA: Diagnosis present

## 2018-07-28 DIAGNOSIS — F32A Depression, unspecified: Secondary | ICD-10-CM | POA: Diagnosis present

## 2018-07-28 DIAGNOSIS — F329 Major depressive disorder, single episode, unspecified: Secondary | ICD-10-CM | POA: Diagnosis present

## 2018-07-28 DIAGNOSIS — K5792 Diverticulitis of intestine, part unspecified, without perforation or abscess without bleeding: Secondary | ICD-10-CM | POA: Diagnosis present

## 2018-07-28 DIAGNOSIS — K5732 Diverticulitis of large intestine without perforation or abscess without bleeding: Secondary | ICD-10-CM

## 2018-07-28 DIAGNOSIS — A419 Sepsis, unspecified organism: Principal | ICD-10-CM | POA: Diagnosis present

## 2018-07-28 DIAGNOSIS — R911 Solitary pulmonary nodule: Secondary | ICD-10-CM | POA: Diagnosis present

## 2018-07-28 DIAGNOSIS — E871 Hypo-osmolality and hyponatremia: Secondary | ICD-10-CM | POA: Diagnosis not present

## 2018-07-28 DIAGNOSIS — B965 Pseudomonas (aeruginosa) (mallei) (pseudomallei) as the cause of diseases classified elsewhere: Secondary | ICD-10-CM | POA: Diagnosis present

## 2018-07-28 DIAGNOSIS — R6510 Systemic inflammatory response syndrome (SIRS) of non-infectious origin without acute organ dysfunction: Secondary | ICD-10-CM

## 2018-07-28 DIAGNOSIS — R109 Unspecified abdominal pain: Secondary | ICD-10-CM

## 2018-07-28 DIAGNOSIS — K5740 Diverticulitis of both small and large intestine with perforation and abscess without bleeding: Secondary | ICD-10-CM | POA: Diagnosis present

## 2018-07-28 LAB — CBC WITH DIFFERENTIAL/PLATELET
Abs Immature Granulocytes: 0.05 10*3/uL (ref 0.00–0.07)
Basophils Absolute: 0.1 10*3/uL (ref 0.0–0.1)
Basophils Relative: 0 %
Eosinophils Absolute: 0.6 10*3/uL — ABNORMAL HIGH (ref 0.0–0.5)
Eosinophils Relative: 4 %
HCT: 43.1 % (ref 39.0–52.0)
Hemoglobin: 14.2 g/dL (ref 13.0–17.0)
Immature Granulocytes: 0 %
Lymphocytes Relative: 8 %
Lymphs Abs: 1.2 10*3/uL (ref 0.7–4.0)
MCH: 29.6 pg (ref 26.0–34.0)
MCHC: 32.9 g/dL (ref 30.0–36.0)
MCV: 89.8 fL (ref 80.0–100.0)
Monocytes Absolute: 1.1 10*3/uL — ABNORMAL HIGH (ref 0.1–1.0)
Monocytes Relative: 8 %
Neutro Abs: 10.9 10*3/uL — ABNORMAL HIGH (ref 1.7–7.7)
Neutrophils Relative %: 80 %
Platelets: 234 10*3/uL (ref 150–400)
RBC: 4.8 MIL/uL (ref 4.22–5.81)
RDW: 12.7 % (ref 11.5–15.5)
WBC: 13.9 10*3/uL — ABNORMAL HIGH (ref 4.0–10.5)
nRBC: 0 % (ref 0.0–0.2)

## 2018-07-28 LAB — COMPREHENSIVE METABOLIC PANEL
ALT: 20 U/L (ref 0–44)
AST: 21 U/L (ref 15–41)
Albumin: 3.9 g/dL (ref 3.5–5.0)
Alkaline Phosphatase: 60 U/L (ref 38–126)
Anion gap: 9 (ref 5–15)
BUN: 10 mg/dL (ref 6–20)
CO2: 23 mmol/L (ref 22–32)
Calcium: 8.9 mg/dL (ref 8.9–10.3)
Chloride: 103 mmol/L (ref 98–111)
Creatinine, Ser: 0.63 mg/dL (ref 0.61–1.24)
GFR calc Af Amer: >60 mL/min (ref >60)
GFR calc non Af Amer: >60 mL/min (ref >60)
Glucose, Bld: 108 mg/dL — ABNORMAL HIGH (ref 70–99)
Potassium: 3.7 mmol/L (ref 3.5–5.1)
Sodium: 135 mmol/L (ref 135–145)
Total Bilirubin: 0.5 mg/dL (ref 0.3–1.2)
Total Protein: 7.2 g/dL (ref 6.5–8.1)

## 2018-07-28 LAB — URINALYSIS, ROUTINE W REFLEX MICROSCOPIC
Bilirubin Urine: NEGATIVE
Glucose, UA: NEGATIVE mg/dL
Ketones, ur: NEGATIVE mg/dL
Leukocytes,Ua: NEGATIVE
Nitrite: NEGATIVE
Protein, ur: NEGATIVE mg/dL
Specific Gravity, Urine: 1.025 (ref 1.005–1.030)
pH: 6 (ref 5.0–8.0)

## 2018-07-28 LAB — URINALYSIS, MICROSCOPIC (REFLEX)

## 2018-07-28 LAB — LIPASE, BLOOD: Lipase: 26 U/L (ref 11–51)

## 2018-07-28 LAB — LACTIC ACID, PLASMA
Lactic Acid, Venous: 1.4 mmol/L (ref 0.5–1.9)
Lactic Acid, Venous: 1.4 mmol/L (ref 0.5–1.9)

## 2018-07-28 LAB — SARS CORONAVIRUS 2 AG (30 MIN TAT): SARS Coronavirus 2 Ag: NEGATIVE

## 2018-07-28 MED ORDER — METRONIDAZOLE IN NACL 5-0.79 MG/ML-% IV SOLN
500.0000 mg | Freq: Three times a day (TID) | INTRAVENOUS | Status: DC
  Administered 2018-07-29: 500 mg via INTRAVENOUS
  Filled 2018-07-28 (×2): qty 100

## 2018-07-28 MED ORDER — METRONIDAZOLE IN NACL 5-0.79 MG/ML-% IV SOLN
500.0000 mg | Freq: Once | INTRAVENOUS | Status: AC
  Administered 2018-07-28: 20:00:00 500 mg via INTRAVENOUS
  Filled 2018-07-28: qty 100

## 2018-07-28 MED ORDER — ACETAMINOPHEN 325 MG PO TABS: 650.0000 mg | ORAL_TABLET | Freq: Four times a day (QID) | ORAL | Status: DC | PRN

## 2018-07-28 MED ORDER — MORPHINE SULFATE (PF) 4 MG/ML IV SOLN
6.0000 mg | Freq: Once | INTRAVENOUS | Status: AC
  Administered 2018-07-28: 19:00:00 6 mg via INTRAVENOUS
  Filled 2018-07-28: qty 2

## 2018-07-28 MED ORDER — MORPHINE SULFATE (PF) 4 MG/ML IV SOLN
4.0000 mg | Freq: Once | INTRAVENOUS | Status: AC
  Administered 2018-07-28: 17:00:00 4 mg via INTRAVENOUS
  Filled 2018-07-28: qty 1

## 2018-07-28 MED ORDER — ACETAMINOPHEN 650 MG RE SUPP: 650.0000 mg | Freq: Four times a day (QID) | RECTAL | Status: DC | PRN

## 2018-07-28 MED ORDER — CIPROFLOXACIN IN D5W 400 MG/200ML IV SOLN
400.0000 mg | Freq: Once | INTRAVENOUS | Status: AC
  Administered 2018-07-28: 19:00:00 400 mg via INTRAVENOUS
  Filled 2018-07-28: qty 200

## 2018-07-28 MED ORDER — SODIUM CHLORIDE 0.9 % IV BOLUS: 1500.0000 mL | Freq: Once | INTRAVENOUS | Status: DC

## 2018-07-28 MED ORDER — ONDANSETRON HCL 4 MG/2ML IJ SOLN
4.0000 mg | Freq: Once | INTRAMUSCULAR | Status: AC
  Administered 2018-07-28: 17:00:00 4 mg via INTRAVENOUS
  Filled 2018-07-28: qty 2

## 2018-07-28 MED ORDER — ALBUTEROL SULFATE (2.5 MG/3ML) 0.083% IN NEBU: 3.0000 mL | INHALATION_SOLUTION | RESPIRATORY_TRACT | Status: DC | PRN

## 2018-07-28 MED ORDER — SODIUM CHLORIDE 0.9 % IV BOLUS
1000.0000 mL | Freq: Once | INTRAVENOUS | Status: AC
  Administered 2018-07-28: 1000 mL via INTRAVENOUS

## 2018-07-28 MED ORDER — CIPROFLOXACIN IN D5W 400 MG/200ML IV SOLN
400.0000 mg | Freq: Two times a day (BID) | INTRAVENOUS | Status: DC
  Administered 2018-07-29: 400 mg via INTRAVENOUS
  Filled 2018-07-28: qty 200

## 2018-07-28 MED ORDER — MORPHINE SULFATE (PF) 2 MG/ML IV SOLN
2.0000 mg | INTRAVENOUS | Status: DC | PRN
  Administered 2018-07-28: 2 mg via INTRAVENOUS
  Filled 2018-07-28: qty 1

## 2018-07-28 MED ORDER — ACETAMINOPHEN 325 MG PO TABS
650.0000 mg | ORAL_TABLET | Freq: Once | ORAL | Status: AC
  Administered 2018-07-28: 19:00:00 650 mg via ORAL
  Filled 2018-07-28: qty 2

## 2018-07-28 MED ORDER — IOHEXOL 300 MG/ML  SOLN
100.0000 mL | Freq: Once | INTRAMUSCULAR | Status: AC | PRN
  Administered 2018-07-28: 18:00:00 100 mL via INTRAVENOUS

## 2018-07-28 MED ORDER — OXYCODONE-ACETAMINOPHEN 5-325 MG PO TABS
1.0000 | ORAL_TABLET | ORAL | Status: DC | PRN
  Administered 2018-07-29 – 2018-07-30 (×4): 1 via ORAL
  Filled 2018-07-28 (×4): qty 1

## 2018-07-28 MED ORDER — METOCLOPRAMIDE HCL 5 MG/ML IJ SOLN
10.0000 mg | Freq: Once | INTRAMUSCULAR | Status: AC
  Administered 2018-07-28: 19:00:00 10 mg via INTRAVENOUS
  Filled 2018-07-28: qty 2

## 2018-07-28 MED ORDER — ONDANSETRON HCL 4 MG/2ML IJ SOLN
4.0000 mg | Freq: Three times a day (TID) | INTRAMUSCULAR | Status: DC | PRN
  Administered 2018-08-01: 4 mg via INTRAVENOUS
  Filled 2018-07-28: qty 2

## 2018-07-28 MED ORDER — SODIUM CHLORIDE 0.9 % IV SOLN
INTRAVENOUS | Status: DC
  Administered 2018-07-29: 1000 mL via INTRAVENOUS
  Administered 2018-07-29 – 2018-07-30 (×3): via INTRAVENOUS

## 2018-07-28 NOTE — ED Notes (Addendum)
Right flank pain that wraps around to RLQ, as well as some left side abdominal pain and nausea

## 2018-07-28 NOTE — H&P (Addendum)
History and Physical    Momin Misko EAV:[id number] DOB: Jan 19, 1981 DOA: 07/28/2018  Referring MD/NP/PA:   PCP: Patient, No Pcp Per   Patient coming from:  The patient is coming from home.  At baseline, pt is independent for most of ADL.        Chief Complaint: Abdominal pain, fever  HPI: Kenyen Candy is a 38 y.o. male with medical history significant of asthma, depression, bipolar, former smoker, marijuana abuse, who presents with abdominal pain.  Patient states that he has been having abdominal pain in the past 2 days, which has been progressively worsening.  The pain is located in left lower quadrant, sharp, severe, 9 out of 10 severity, radiating to the left flank and groin area.  Patient denies nausea, vomiting, diarrhea.  He has fever and chills.  No symptoms of UTI.  Denies chest pain, shortness of breath, cough.  No unilateral weakness.  ED Course: pt was found to have WBC 13.9, lactic acid 1.4, lipase 26, negative urinalysis, negative COVID 19 test, electrolytes renal function okay, temperature 102.7, tachycardia, oxygen saturation 95% on room air.  Patient was accepted by accepting MD to telemetry bed as inpatient.  CT-abd/pelvis: 1. Acute diverticulitis of the left colon with surrounding edema but no abscess or perforation. 2. Nonobstructing small right lower pole renal calculi 3. 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  Review of Systems:   General: has fevers, chills, no body weight gain, has poor appetite, has fatigue HEENT: no blurry vision, hearing changes or sore throat Respiratory: no dyspnea, coughing, wheezing CV: no chest pain, no palpitations GI: no nausea, vomiting, has abdominal pain, no diarrhea, constipation GU: no dysuria, burning on urination, increased urinary frequency, hematuria  Ext: no leg edema Neuro: no unilateral weakness, numbness, or tingling, no vision change or hearing loss Skin: no rash, no skin tear. MSK: No muscle spasm,  no deformity, no limitation of range of movement in spin Heme: No easy bruising.  Travel history: No recent long distant travel.  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 No pertinent surgical history.  Social History:  reports that he has quit smoking. His smoking use included cigarettes. He has never used smokeless tobacco. He reports current alcohol use. He reports current drug use. Drug: Marijuana.  Family History: Reviewed with patient, patient states that all family members are healthy.   Prior to Admission medications   Medication Sig Start Date End Date Taking? Authorizing Provider  acetaminophen (TYLENOL) 325 MG tablet Take 650 mg by mouth every 6 (six) hours as needed.   Yes [provider]  ibuprofen (ADVIL) 400 MG tablet Take 400 mg by mouth every 6 (six) hours as needed.   Yes [provider]  albuterol (PROVENTIL HFA;VENTOLIN HFA) 108 (90 BASE) MCG/ACT inhaler Inhale 1-2 puffs into the lungs every 6 (six) hours as needed for wheezing. 01/04/13   Palumbo, April, MD  loratadine (CLARITIN) 10 MG tablet Take 1 tablet (10 mg total) by mouth daily. 01/04/13   Palumbo, April, MD  naproxen (NAPROSYN) 500 MG tablet Take 1 tablet twice daily as needed for toe pain. 09/22/17   Molpus, Jonny Ruiz, MD    Physical Exam: Vitals:   07/28/18 1930 07/28/18  2000 07/28/18 2016 07/28/18 2123  BP: 113/65 120/81  114/78  Pulse: (!) 110 (!) 109 (!) 105 (!) 101  Resp: Temp:   99.7 F (37.6 C) 99.9 F (37.7 C)  TempSrc:   Oral Oral  SpO2: 95% 95%  100%  Weight:    78.4 kg  Height:     (1.626 m)   General: Not in acute distress HEENT:       Eyes: PERRL, EOMI, no scleral icterus.       ENT: No discharge from the ears and nose, no pharynx  injection, no tonsillar enlargement.        Neck: No JVD, no bruit, no mass felt. Heme: No neck lymph node enlargement. Cardiac: S1/S2, RRR, No murmurs, No gallops or rubs. Respiratory: No rales, wheezing, rhonchi or rubs. GI: Soft, nondistended, has tenderness in LLQ, no rebound pain, no organomegaly, BS present. GU: No hematuria Ext: No pitting leg edema bilaterally. 2+DP/PT pulse bilaterally. Musculoskeletal: No joint deformities, No joint redness or warmth, no limitation of ROM in spin. Skin: No rashes.  Neuro: Alert, oriented X3, cranial nerves II-XII grossly intact, moves all extremities normally.  Psych: Patient is not psychotic, no suicidal or hemocidal ideation.  Labs on Admission: I have personally reviewed following labs and imaging studies  CBC: Recent Labs  Lab 07/28/18 1700  WBC 13.9*  NEUTROABS 10.9*  HGB 14.2  HCT 43.1  MCV 89.8  PLT 234   Basic Metabolic Panel: Recent Labs  Lab 07/28/18 1700  NA 135  K 3.7  CL 103  CO2 23  GLUCOSE 108*  BUN 10  CREATININE 0.63  CALCIUM 8.9   GFR: Estimated Creatinine Clearance: 118.5 mL/min (by C-G formula based on SCr of 0.63 mg/dL). Liver Function Tests: Recent Labs  Lab 07/28/18 1700  AST 21  ALT 20  ALKPHOS 60  BILITOT 0.5  PROT 7.2  ALBUMIN 3.9   Recent Labs  Lab 07/28/18 1700  LIPASE 26   No results for input(s): AMMONIA in the last 168 hours. Coagulation Profile: No results for input(s): INR, PROTIME in the last 168 hours. Cardiac Enzymes: No results for input(s): CKTOTAL, CKMB, CKMBINDEX, TROPONINI in the last 168 hours. BNP (last 3 results) No results for input(s): PROBNP in the last 8760 hours. HbA1C: No results for input(s): HGBA1C in the last 72 hours. CBG: No results for input(s): GLUCAP in the last 168 hours. Lipid Profile: No results for input(s): CHOL, HDL, LDLCALC, TRIG, CHOLHDL, LDLDIRECT in the last 72 hours. Thyroid Function Tests: No results for input(s): TSH, T4TOTAL,  FREET4, T3FREE, THYROIDAB in the last 72 hours. Anemia Panel: No results for input(s): VITAMINB12, FOLATE, FERRITIN, TIBC, IRON, RETICCTPCT in the last 72 hours. Urine analysis:    Component Value Date/Time   COLORURINE YELLOW 07/28/2018 1641   APPEARANCEUR CLEAR 07/28/2018 1641   LABSPEC 1.025 07/28/2018 1641   PHURINE 6.0 07/28/2018 1641   GLUCOSEU NEGATIVE 07/28/2018 1641   HGBUR TRACE (A) 07/28/2018 1641   BILIRUBINUR NEGATIVE 07/28/2018 1641   KETONESUR NEGATIVE 07/28/2018 1641   PROTEINUR NEGATIVE 07/28/2018 1641   NITRITE NEGATIVE 07/28/2018 1641   LEUKOCYTESUR NEGATIVE 07/28/2018 1641   Sepsis Labs: (procalcitonin:4,lacticidven:4) ) Recent Results (from the past 240 hour(s))  SARS Coronavirus 2 (Hosp order,Performed in [facility] lab via [employer] [facility])     Status: None   Collection Time: 07/28/18  7:10 PM  Result Value Ref Range Status   SARS Coronavirus 2 ([employer] Now)  NEGATIVE NEGATIVE Final    Comment: (NOTE) Interpretive Result Comment(s): COVID 19 Positive SARS CoV 2 target nucleic acids are DETECTED. The SARS CoV 2 RNA is generally detectable in upper and lower respiratory specimens during the acute phase of infection.  Positive results are indicative of active infection with SARS CoV 2.  Clinical correlation with patient history and other diagnostic information is necessary to determine patient infection status.  Positive results do not rule out bacterial infection or coinfection with other viruses. The expected result is Negative. COVID 19 Negative SARS CoV 2 target nucleic acids are NOT DETECTED. The SARS CoV 2 RNA is generally detectable in upper and lower respiratory specimens during the acute phase of infection.  Negative results do not preclude SARS CoV 2 infection, do not rule out coinfections with other pathogens, and should not be used as the sole basis for treatment or other patient management decisions.  Negative results must be combined  with clinical  observations, patient history, and epidemiological information. The expected result is Negative. Invalid Presence or absence of SARS CoV 2 nucleic acids cannot be determined. Repeat testing was performed on the submitted specimen and repeated Invalid results were obtained.  If clinically indicated, additional testing on a new specimen with an alternate test methodology [phone]) is advised.  The SARS CoV 2 RNA is generally detectable in upper and lower respiratory specimens during the acute phase of infection. The expected result is Negative. Fact Sheet for Patients:  http://www.graves-ford.org/ Fact Sheet for Healthcare Providers: EnviroConcern.si This test is not yet approved or cleared by the [place] FDA and has been authorized for detection and/or diagnosis of SARS CoV 2 by FDA under an Emergency Use Authorization (EUA).  This EUA will remain in effect (meaning this test can be used) for the duration of the COVID19 d eclaration under Section 564(b)(1) of the Act, 21 U.S.C. section [phone] 3(b)(1), unless the authorization is terminated or revoked sooner. Performed at [facility], [address] [place], [place] [address]      Radiological Exams on Admission: Ct Abdomen Pelvis W Contrast  Result Date: 07/28/2018 CLINICAL DATA:  Left lower quadrant pain EXAM: CT ABDOMEN AND PELVIS WITH CONTRAST TECHNIQUE: Multidetector CT imaging of the abdomen and pelvis was performed using the standard protocol following bolus administration of intravenous contrast. CONTRAST:  OMNIPAQUE IOHEXOL 300 MG/ML  SOLN COMPARISON:  None. FINDINGS: Lower chest: Lung bases clear bilaterally. Hepatobiliary: No focal liver abnormality is seen. No gallstones, gallbladder wall thickening, or biliary dilatation. Pancreas: Negative Spleen: Negative Adrenals/Urinary Tract: Small nonobstructing calculi right lower pole. No renal  obstruction or mass. Normal bladder. Stomach/Bowel: Segmental thickening of the lower left colon with extensive edema in the surrounding mesentery. Diverticula noted in the left colon and sigmoid colon. Findings compatible with diverticulitis without abscess or perforation. Normal appendix. Distended fluid-filled small bowel loops are present in the abdomen most likely ileus. Stool in the colon without dilatation. Vascular/Lymphatic: No significant vascular findings are present. No enlarged abdominal or pelvic lymph nodes. Reproductive: Mild prostate enlargement Other: Negative for hernia Musculoskeletal: Negative for acute abnormality. 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 IMPRESSION: 1. Acute diverticulitis of the left colon with surrounding edema but no abscess or perforation. 2. Nonobstructing small right lower pole renal calculi 3. 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 Electronically Signed   By: Marlan Palau M.D.   On: 07/28/2018 18:26     EKG: Not done in ED,  will get one.   Assessment/Plan Principal Problem:   Acute diverticulitis Active Problems:   Sepsis (HCC)   Asthma   Depression   Sepsis due to acute diverticulitis: CT scan showed acute diverticulitis of the left colon with surrounding edema but no abscess or perforation. He meets criteria for sepsis with leukocytosis, tachycardia and fever.  Currently hemodynamically stable.  -Patient is accepted by accepting MD to telemetry bed as inpatient, which is appropriate. -IV Cipro and Flagyl -Follow-up blood culture -PRN morphine and Percocet for pain, Zofran for nausea --will get Procalcitonin and trend lactic acid levels per sepsis protocol. -IVF: 2L of NS bolus in ED, followed by 125 cc/h   Asthma: stable. -prn albuterol inhaler  Depression: Stable, no suicidal or homicidal ideations. Not on meds at home. -Observe       Inpatient status:  # Patient requires inpatient  status due to high intensity of service, high risk for further deterioration and high frequency of surveillance required.  I certify that at the point of admission it is my clinical judgment that the patient will require inpatient hospital care spanning beyond 2 midnights from the point of admission.   This patient has multiple chronic comorbidities including  asthma, depression, bipolar, former smoker, marijuana abuse  Now patient has presenting with abdominal pain  The worrisome physical exam findings include abdominal tenderness in left lower quadrant  The initial radiographic and laboratory data are worrisome because of acute diverticulitis and sepsis.  Current medical needs: please see my assessment and plan  Predictability of an adverse outcome (risk): Patient's presents with acute diverticulitis, and meets criteria for sepsis.  He still have severe abdominal pain.  Patient has risk of bowel perforation, and is at high risk of deteriorating, will need to be treated in hospital for at least 2 days.    DVT ppx: SCD Code Status: Full code Family Communication: None at bed side.    Disposition Plan:  Anticipate discharge back to previous home environment Consults called:  none Admission status:   Inpatient/tele      Date of Service 07/28/2018    Lorretta HarpXilin Alaja Goldinger [facility]   If 7PM-7AM, please contact night-coverage [email or web address] Password [email or web address] 07/28/2018, 11:45 PM

## 2018-07-28 NOTE — Progress Notes (Addendum)
Pharmacy Antibiotic Note  Jay Davis is a 38 y.o. male admitted on 07/28/2018 with intra-abdominal infection. Pharmacy has been consulted for cipro dosing (he is also on flagyl). -WBC= 13.9, tmax= 102.7, CrCl > 100 -cipro 400mg  IV given at 7:30pm today  Plan: -Cipro 400mg  IV q12h -Will follow renal function, cultures and clinical progress    Height: 5\' 4"  (162.6 cm) Weight: 172 lb 12.8 oz (78.4 kg) IBW/kg (Calculated) : 59.2  Temp (24hrs), Avg:100.4 F (38 C), Min:99.3 F (37.4 C), Max:102.7 F (39.3 C)  Recent Labs  Lab 07/28/18 1700 07/28/18 1856  WBC 13.9*  --   CREATININE 0.63  --   LATICACIDVEN  --  1.4    Estimated Creatinine Clearance: 118.5 mL/min (by C-G formula based on SCr of 0.63 mg/dL).    Allergies  Allergen Reactions  . 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 Pork-Derived Products     Antimicrobials this admission: 5/13 cipro 5/13 flagyl  Dose adjustments this admission:   Microbiology results: 5/13 blood x2  Thank you for allowing pharmacy to be a part of this patient's care.  Harland German, PharmD Clinical Pharmacist **Pharmacist phone directory can now be found on [email or web address] ([email or web address]).  Listed under [facility].

## 2018-07-28 NOTE — ED Notes (Signed)
Report given to French Ana, Charity fundraiser at [facility]

## 2018-07-28 NOTE — ED Notes (Signed)
Patient transported to CT 

## 2018-07-28 NOTE — ED Notes (Signed)
ED TO INPATIENT HANDOFF REPORT  ED Nurse Name and Phone #: Maralyn Sago [phone]  S Name/Age/Gender Jay Davis 38 y.o. male Room/Bed: MH01/MH01  Code Status   Code Status: Not on file  Home/SNF/Other Home Patient oriented to: self, place, time and situation Is this baseline? Yes   Triage Complete: Triage complete  Chief Complaint abdominal pain  Triage Note Pt c/o abd pain and right lower back pain after dropping sofa x 1 day ago , increased pain with movt   Allergies Allergies  Allergen Reactions  . 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 Pork-Derived Products     Level of Care/Admitting Diagnosis ED Disposition    ED Disposition Condition Comment   Admit  Hospital Area: [facility] [[id number]]  Level of Care: Telemetry Medical [104]  Covid Evaluation: Screening Protocol (No Symptoms)  Diagnosis: Acute diverticulitis [1219758]  Admitting Physician: Eduard Clos [id number]  Attending Physician: Eduard Clos [phone]  Estimated length of stay: past midnight tomorrow  Certification:: I certify this patient will need inpatient services for at least 2 midnights  PT Class (Do Not Modify): Inpatient [101]  PT Acc Code (Do Not Modify): Private [1]       B Medical/Surgery History Past Medical History:  Diagnosis Date  . Bipolar 1 disorder (HCC)   . Depression    History reviewed. No pertinent surgical history.   A IV Location/Drains/Wounds Patient Lines/Drains/Airways Status   Active Line/Drains/Airways    Name:   Placement date:   Placement time:   Site:   Days:   Peripheral IV 07/28/18 Right Antecubital   07/28/18    1711    Antecubital   less than 1   Peripheral IV 07/28/18 Right Forearm   07/28/18    1905    Forearm   less than 1          Intake/Output Last 24 hours No intake or output data in the 24 hours ending 07/28/18 1954  Labs/Imaging Results for orders placed or performed  during the hospital encounter of 07/28/18 (from the past 48 hour(s))  Urinalysis, Routine w reflex microscopic     Status: Abnormal   Collection Time: 07/28/18  4:41 PM  Result Value Ref Range   Color, Urine YELLOW YELLOW   APPearance CLEAR CLEAR   Specific Gravity, Urine 1.025 1.005 - 1.030   pH 6.0 5.0 - 8.0   Glucose, UA NEGATIVE NEGATIVE mg/dL   Hgb urine dipstick TRACE (A) NEGATIVE   Bilirubin Urine NEGATIVE NEGATIVE   Ketones, ur NEGATIVE NEGATIVE mg/dL   Protein, ur NEGATIVE NEGATIVE mg/dL   Nitrite NEGATIVE NEGATIVE   Leukocytes,Ua NEGATIVE NEGATIVE    Comment: Performed at [facility], [address] [place], [place] [address]  Urinalysis, Microscopic (reflex)     Status: Abnormal   Collection Time: 07/28/18  4:41 PM  Result Value Ref Range   RBC / HPF 0-5 0 - 5 RBC/hpf   WBC, UA 0-5 0 - 5 WBC/hpf   Bacteria, UA RARE (A) NONE SEEN   Squamous Epithelial / LPF 0-5 0 - 5    Comment: Performed at [facility], [address] [place], [place] [address]  CBC with Differential     Status: Abnormal   Collection Time: 07/28/18  5:00 PM  Result Value Ref Range   WBC 13.9 (H) 4.0 - 10.5 K/uL   RBC 4.80 4.22 -  5.81 MIL/uL   Hemoglobin 14.2 13.0 - 17.0 g/dL   HCT 16.143.1 09.639.0 - 04.552.0 %   MCV 89.8 80.0 - 100.0 fL   MCH 29.6 26.0 - 34.0 pg   MCHC 32.9 30.0 - 36.0 g/dL   RDW 40.912.7 81.111.5 - 91.415.5 %   Platelets 234 150 - 400 K/uL   nRBC 0.0 0.0 - 0.2 %   Neutrophils Relative % 80 %   Neutro Abs 10.9 (H) 1.7 - 7.7 K/uL   Lymphocytes Relative 8 %   Lymphs Abs 1.2 0.7 - 4.0 K/uL   Monocytes Relative 8 %   Monocytes Absolute 1.1 (H) 0.1 - 1.0 K/uL   Eosinophils Relative 4 %   Eosinophils Absolute 0.6 (H) 0.0 - 0.5 K/uL   Basophils Relative 0 %   Basophils Absolute 0.1 0.0 - 0.1 K/uL   Immature Granulocytes 0 %   Abs Immature Granulocytes 0.05 0.00 - 0.07 K/uL    Comment: Performed at [facility] [place], [address] [place], [place] [address]   Comprehensive metabolic panel     Status: Abnormal   Collection Time: 07/28/18  5:00 PM  Result Value Ref Range   Sodium 135 135 - 145 mmol/L   Potassium 3.7 3.5 - 5.1 mmol/L   Chloride 103 98 - 111 mmol/L   CO2 23 22 - 32 mmol/L   Glucose, Bld 108 (H) 70 - 99 mg/dL   BUN 10 6 - 20 mg/dL   Creatinine, Ser 6.210.63 0.61 - 1.24 mg/dL   Calcium 8.9 8.9 - 30.810.3 mg/dL   Total Protein 7.2 6.5 - 8.1 g/dL   Albumin 3.9 3.5 - 5.0 g/dL   AST 21 15 - 41 U/L   ALT 20 0 - 44 U/L   Alkaline Phosphatase 60 38 - 126 U/L   Total Bilirubin 0.5 0.3 - 1.2 mg/dL   GFR calc non Af Amer >60 >60 mL/min   GFR calc Af Amer >60 >60 mL/min   Anion gap 9 5 - 15    Comment: Performed at [facility] [place], [address] [place], [place] [address]  Lipase, blood     Status: None   Collection Time: 07/28/18  5:00 PM  Result Value Ref Range   Lipase 26 11 - 51 U/L    Comment: Performed at [facility], [address] [place], [place] [address]  Lactic acid, plasma     Status: None   Collection Time: 07/28/18  6:56 PM  Result Value Ref Range   Lactic Acid, Venous 1.4 0.5 - 1.9 mmol/L    Comment: Performed at [facility] [place], [address] [place], [place] [address]   Ct Abdomen Pelvis W Contrast  Result Date: 07/28/2018 CLINICAL DATA:  Left lower quadrant pain EXAM: CT ABDOMEN AND PELVIS WITH CONTRAST TECHNIQUE: Multidetector CT imaging of the abdomen and pelvis was performed using the standard protocol following bolus administration of intravenous contrast. CONTRAST:  100mL OMNIPAQUE IOHEXOL 300 MG/ML  SOLN COMPARISON:  None. FINDINGS: Lower chest: Lung bases clear bilaterally. Hepatobiliary: No focal liver abnormality is seen. No gallstones, gallbladder wall thickening, or biliary dilatation. Pancreas: Negative Spleen: Negative Adrenals/Urinary Tract: Small nonobstructing calculi right lower pole. No renal obstruction or mass. Normal bladder. Stomach/Bowel: Segmental thickening of the  lower left colon with extensive edema in the surrounding mesentery. Diverticula noted in the left colon and sigmoid colon. Findings compatible with diverticulitis without abscess or perforation. Normal appendix. Distended  fluid-filled small bowel loops are present in the abdomen most likely ileus. Stool in the colon without dilatation. Vascular/Lymphatic: No significant vascular findings are present. No enlarged abdominal or pelvic lymph nodes. Reproductive: Mild prostate enlargement Other: Negative for hernia Musculoskeletal: Negative for acute abnormality. 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 IMPRESSION: 1. Acute diverticulitis of the left colon with surrounding edema but no abscess or perforation. 2. Nonobstructing small right lower pole renal calculi 3. 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 Electronically Signed   By: Marlan Palau M.D.   On: 07/28/2018 18:26    Pending Labs Unresulted Labs (From admission, onward)    Start     Ordered   07/28/18 1902  SARS Coronavirus 2 (Hosp order,Performed in [facility] lab via [employer] [facility])  (Asymptomatic Patients Labs)  Once,   R    Question:  Rule Out  Answer:  Yes   07/28/18 1901   07/28/18 1838  Blood culture (routine x 2)  BLOOD CULTURE X 2,   STAT     07/28/18 1838          Vitals/Pain Today's Vitals   07/28/18 1620 07/28/18 1855 07/28/18 1856 07/28/18 1930  BP: 128/81  129/80 113/65  Pulse: 89  (!) 104 (!) 110  Resp: Temp: 99.3 F (37.4 C) (!) 102.7 F (39.3 C)    TempSrc: Oral Rectal Rectal   SpO2: 100%  100% 95%  Weight:      Height:      PainSc:        Isolation Precautions No active isolations  Medications Medications  ciprofloxacin (CIPRO) IVPB 400 mg (400 mg Intravenous New Bag/Given 07/28/18 1928)  metroNIDAZOLE (FLAGYL) IVPB 500 mg (500 mg Intravenous New Bag/Given 07/28/18 1946)  morphine 4 MG/ML injection 4 mg (4 mg Intravenous Given 07/28/18 1709)  ondansetron  (ZOFRAN) injection 4 mg (4 mg Intravenous Given 07/28/18 1708)  iohexol (OMNIPAQUE) 300 MG/ML solution 100 mL (100 mLs Intravenous Contrast Given 07/28/18 1804)  metoCLOPramide (REGLAN) injection 10 mg (10 mg Intravenous Given 07/28/18 1921)  morphine 4 MG/ML injection 6 mg (6 mg Intravenous Given 07/28/18 1921)  sodium chloride 0.9 % bolus 1,000 mL (1,000 mLs Intravenous New Bag/Given 07/28/18 1910)  acetaminophen (TYLENOL) tablet 650 mg (650 mg Oral Given 07/28/18 1919)    Mobility walks Low fall risk   Focused Assessments GI and muscular assessment   R Recommendations: See Admitting Provider Note  Report given to:   Additional Notes: Pt speaks English very well, but may request a translator for the physician visit. Has right flank pain that radiates to RLQ with left side abdominal pain and nausea since yesterday. He is able to hold down PO fluids, and wife was updated to plan of care.

## 2018-07-28 NOTE — ED Notes (Signed)
Pt returned from CT °

## 2018-07-28 NOTE — ED Triage Notes (Signed)
Pt c/o abd pain and right lower back pain after dropping sofa x 1 day ago , increased pain with movt

## 2018-07-28 NOTE — ED Provider Notes (Signed)
[place] EMERGENCY DEPARTMENT Provider Note   CSN: [id number] Arrival date & time: 07/28/18  1609    History   Chief Complaint Chief Complaint  Patient presents with   Abdominal Pain    HPI Jay Davis is a 38 y.o. male with history of former tobacco use, asthma, marijuana use, depression is here for evaluation of left lower quadrant abdominal pain.  Onset 2 days ago.  Described as sharp, severe, worse at the left lower quadrant and mid abdomen with radiation to the left flank and left groin.  Pain is constant, severe, but significantly worse with palpation, moving, sitting up, walking, coughing or taking deep breaths.  Slightly better if he stays still.  He rubbed a pomade made out of bee venom, tried ibuprofen and Tylenol, muscle relaxer with mild relief.  The pain makes him feel hot but unknown fever.  Initially he thought pain was related to heavy lifting because 2 days ago he picked up a heavy sofa and lost control of it, felt a sharp "pull" to the left lower abdomen and flank.  That night the pain was mild and he had diffuse back pain, buttock pain and leg pain that he attributes to the lifting.  Yesterday morning he woke up and the left lower abdominal pain was significantly worse.  He also noticed a pea-sized "bulging" to his left groin which his mom told him happens with lifting/walking for too long.  He slept with his legs lifted up against the wall and this bulging resolved.  No changes in pain with eating, urination.     No associated nausea, vomiting, diarrhea, constipation, hematuria, dysuria. Last BM PTA normal.  No associated CP, SOB, distal paresthesias or numbness.  No h/o hernia, renal stones, abdominal surgeries. Quit tobacco at 20 now occasional use. Occasional ETOH use. Marijuana 1-2 blunts daily .       HPI  Past Medical History:  Diagnosis Date   Bipolar 1 disorder [facility])    Depression     Patient Active Problem List   Diagnosis Date Noted    Acute diverticulitis 07/28/2018    History reviewed. No pertinent surgical history.      Home Medications    Prior to Admission medications   Medication Sig Start Date End Date Taking? Authorizing Provider  acetaminophen (TYLENOL) 325 MG tablet Take 650 mg by mouth every 6 (six) hours as needed.   Yes [provider]  ibuprofen (ADVIL) 400 MG tablet Take 400 mg by mouth every 6 (six) hours as needed.   Yes [provider]  albuterol (PROVENTIL HFA;VENTOLIN HFA) 108 (90 BASE) MCG/ACT inhaler Inhale 1-2 puffs into the lungs every 6 (six) hours as needed for wheezing. 01/04/13   Palumbo, April, MD  loratadine (CLARITIN) 10 MG tablet Take 1 tablet (10 mg total) by mouth daily. 01/04/13   Palumbo, April, MD  naproxen (NAPROSYN) 500 MG tablet Take 1 tablet twice daily as needed for toe pain. 09/22/17   Molpus, Jenny Reichmann, MD    Family History History reviewed. No pertinent family history.  Social History Social History   Tobacco Use   Smoking status: Former Smoker    Types: Cigarettes   Smokeless tobacco: Never Used  Substance Use Topics   Alcohol use: Yes    Comment: occ   Drug use: Yes    Types: Marijuana     Allergies   Ambien [zolpidem tartrate] and Pork-derived products   Review of Systems Review of Systems  Gastrointestinal: Positive for  abdominal pain.  Genitourinary: Positive for flank pain.  Musculoskeletal: Positive for back pain.     Physical Exam Updated Vital Signs BP 113/65    Pulse (!) 110    Temp (!) 102.7 F (39.3 C) (Rectal)    Resp 16    Ht _0  (1.626 m)    Wt 79.8 kg    SpO2 95%    BMI 30.21 kg/m   Physical Exam Vitals signs and nursing note reviewed.  Constitutional:      Appearance: He is well-developed.     Comments: Non toxic but looks uncomfortable, holding LL abdomen   HENT:     Head: Normocephalic and atraumatic.     Nose: Nose normal.  Eyes:     Conjunctiva/sclera: Conjunctivae normal.  Neck:     Musculoskeletal:  Normal range of motion.  Cardiovascular:     Rate and Rhythm: Normal rate and regular rhythm.     Heart sounds: Normal heart sounds.     Comments: 1+ radial and DP pulses bilaterally  Pulmonary:     Effort: Pulmonary effort is normal.     Breath sounds: Normal breath sounds.  Abdominal:     General: Bowel sounds are normal.     Palpations: Abdomen is soft.     Tenderness: There is abdominal tenderness in the left lower quadrant. There is left CVA tenderness and guarding.     Comments: Obese vs mild abdominal distention.  Moderate pain and guarding with mild pressure in LLQ, left mid abdomen and with L CVA percussion.  Easily reducible, non tender umbilical hernia. No obvious pulsatility. Active BS to lower quadrants   Genitourinary:    Comments: Mild TTP to left inguinal crease. 1+ symmetric femoral pulses.  No inguinal bulging or obvious hernia.  Musculoskeletal: Normal range of motion.     Comments: TL spine: no midline tenderness. Mild very lateral LEFT muscular tenderness with palpation and percussion.  Pain to LL abdomen reproduced with sitting up, moving in bed   Skin:    General: Skin is warm and dry.     Capillary Refill: Capillary refill takes less than 2 seconds.  Neurological:     Mental Status: He is alert.     Comments: Strength and sensation to light touch in upper and lower extremities intact  Psychiatric:        Behavior: Behavior normal.      ED Treatments / Results  Labs (all labs ordered are listed, but only abnormal results are displayed) Labs Reviewed  URINALYSIS, ROUTINE W REFLEX MICROSCOPIC - Abnormal; Notable for the following components:      Result Value   Hgb urine dipstick TRACE (*)    All other components within normal limits  CBC WITH DIFFERENTIAL/PLATELET - Abnormal; Notable for the following components:   WBC 13.9 (*)    Neutro Abs 10.9 (*)    Monocytes Absolute 1.1 (*)    Eosinophils Absolute 0.6 (*)    All other components within normal  limits  COMPREHENSIVE METABOLIC PANEL - Abnormal; Notable for the following components:   Glucose, Bld 108 (*)    All other components within normal limits  URINALYSIS, MICROSCOPIC (REFLEX) - Abnormal; Notable for the following components:   Bacteria, UA RARE (*)    All other components within normal limits  CULTURE, BLOOD (ROUTINE X 2)  CULTURE, BLOOD (ROUTINE X 2)  SARS CORONAVIRUS 2 (HOSP ORDER, PERFORMED IN [facility] VIA ABBOTT ID)  LIPASE, BLOOD  LACTIC ACID, PLASMA  EKG None  Radiology Ct Abdomen Pelvis W Contrast  Result Date: 07/28/2018 CLINICAL DATA:  Left lower quadrant pain EXAM: CT ABDOMEN AND PELVIS WITH CONTRAST TECHNIQUE: Multidetector CT imaging of the abdomen and pelvis was performed using the standard protocol following bolus administration of intravenous contrast. CONTRAST:  119m OMNIPAQUE IOHEXOL 300 MG/ML  SOLN COMPARISON:  None. FINDINGS: Lower chest: Lung bases clear bilaterally. Hepatobiliary: No focal liver abnormality is seen. No gallstones, gallbladder wall thickening, or biliary dilatation. Pancreas: Negative Spleen: Negative Adrenals/Urinary Tract: Small nonobstructing calculi right lower pole. No renal obstruction or mass. Normal bladder. Stomach/Bowel: Segmental thickening of the lower left colon with extensive edema in the surrounding mesentery. Diverticula noted in the left colon and sigmoid colon. Findings compatible with diverticulitis without abscess or perforation. Normal appendix. Distended fluid-filled small bowel loops are present in the abdomen most likely ileus. Stool in the colon without dilatation. Vascular/Lymphatic: No significant vascular findings are present. No enlarged abdominal or pelvic lymph nodes. Reproductive: Mild prostate enlargement Other: Negative for hernia Musculoskeletal: Negative for acute abnormality. 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 IMPRESSION: 1. Acute diverticulitis of the left colon with  surrounding edema but no abscess or perforation. 2. Nonobstructing small right lower pole renal calculi 3. 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 Electronically Signed   By: CFranchot GalloM.D.   On: 07/28/2018 18:26    Procedures .Critical Care Performed by: GKinnie Feil PA-C Authorized by: GKinnie Feil PA-C   Critical care provider statement:    Critical care time (minutes):  45   Critical care was necessary to treat or prevent imminent or life-threatening deterioration of the following conditions:  Sepsis   Critical care was time spent personally by me on the following activities:  Discussions with consultants, evaluation of patient's response to treatment, examination of patient, ordering and performing treatments and interventions, ordering and review of laboratory studies, ordering and review of radiographic studies, pulse oximetry, re-evaluation of patient's condition, obtaining history from patient or surrogate and review of old charts   I assumed direction of critical care for this patient from another provider in my specialty: no     (including critical care time)  Medications Ordered in ED Medications  ciprofloxacin (CIPRO) IVPB 400 mg (400 mg Intravenous New Bag/Given 07/28/18 1928)  metroNIDAZOLE (FLAGYL) IVPB 500 mg (500 mg Intravenous New Bag/Given 07/28/18 1946)  morphine 4 MG/ML injection 4 mg (4 mg Intravenous Given 07/28/18 1709)  ondansetron (ZOFRAN) injection 4 mg (4 mg Intravenous Given 07/28/18 1708)  iohexol (OMNIPAQUE) 300 MG/ML solution 100 mL (100 mLs Intravenous Contrast Given 07/28/18 1804)  metoCLOPramide (REGLAN) injection 10 mg (10 mg Intravenous Given 07/28/18 1921)  morphine 4 MG/ML injection 6 mg (6 mg Intravenous Given 07/28/18 1921)  sodium chloride 0.9 % bolus 1,000 mL (1,000 mLs Intravenous New Bag/Given 07/28/18 1910)  acetaminophen (TYLENOL) tablet 650 mg (650 mg Oral Given 07/28/18 1919)     Initial Impression /  Assessment and Plan / ED Course  I have reviewed the triage vital signs and the nursing notes.  Pertinent labs & imaging results that were available during my care of the patient were reviewed by me and considered in my medical decision making (see chart for details).  Clinical Course as of Jul 28 1950  Wed Jul 28, 2018  1700 Hgb urine dipstick(!): TRACE [CG]  1700 Bacteria, UA(!): RARE [CG]  1745 WBC(!): 13.9 [CG]  1824 Re-evaluated pt, he has had return of  pain. LLQ, left mid abdomen tenderness with guarding. +Pain with shaking in bed.    [CG]  1834 Stomach/Bowel: Segmental thickening of the lower left colon with extensive edema in the surrounding mesentery. Diverticula noted in the left colon and sigmoid colon. Findings compatible with diverticulitis without abscess or perforation.  Normal appendix.  Distended fluid-filled small bowel loops are present in the abdomen most likely ileus. Stool in the colon without dilatation.  Vascular/Lymphatic: No significant vascular findings are present. No enlarged abdominal or pelvic lymph nodes.  Other: Negative for hernia  Musculoskeletal: Negative for acute abnormality. 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  IMPRESSION: 1. Acute diverticulitis of the left colon with surrounding edema but no abscess or perforation. 2. Nonobstructing small right lower pole renal calculi 3. 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   CT ABDOMEN PELVIS W CONTRAST [CG]  1843 Re-evaluated patient. He is more uncomfortable, awaiting meds.  Now some nausea.  Discussed CT findings, recommendation to admit. He is hesitant but agreeable with this.    [CG]  1855 Temp(!): 102.7 F (39.3 C) [CG]  1901 Pulse Rate(!): 104 [CG]    Clinical Course User Index [CG] Kinnie Feil, PA-C       Highest on ddx is MSK etiology possibly viral gastroenteritis, however his abd exam is concerning for intraabdominal pathology  including symptomatic hernia given recent heavy lifting and bulging he noticed in inguinal crease yesterday.  Diverticulitis, ileus, SBO lower on ddx since no n/v/fever, changes in BM, previous surgeries.  He has no CP, SOB, distal paresthesias or pulse/neuro deficits and dissection is less likely. Will obtain UA, labs, reassess after morphine.   1848: WBC 13.9. CTAP with colon diverticulitis and extensive edema, possible ileus.  Pain has been refractory now with nausea.  Abd exam concerning for early peritonitis.  Will obtain rectal temp.  Given WBC, refractory pain, extensive edema from diverticulitis, feel patient warrants admission for serial abd exams, IV abx, symptom control. He has no PCP. Discussed with ED MD.   9678: rectal temp 102.7. SIRS criteria met with fever, leukocytosis, HR >90. Source of infection intraabdominal/diverticulitis. Sepsis code activated. IV antibiotics, 1 L IVF ordered. Pending lactate. Normal BP. Pending hospitalist consult.  Final Clinical Impressions(s) / ED Diagnoses   Final diagnoses:  Diverticulitis, colon  SIRS (systemic inflammatory response syndrome) [facility])    ED Discharge Orders    None       Arlean Hopping 07/28/18 1952    Charlesetta Shanks, MD 07/28/18 2018

## 2018-07-29 ENCOUNTER — Encounter (HOSPITAL_COMMUNITY): Payer: Self-pay

## 2018-07-29 ENCOUNTER — Other Ambulatory Visit: Payer: Self-pay

## 2018-07-29 DIAGNOSIS — D72825 Bandemia: Secondary | ICD-10-CM

## 2018-07-29 DIAGNOSIS — E876 Hypokalemia: Secondary | ICD-10-CM

## 2018-07-29 LAB — CBC
HCT: 40.5 % (ref 39.0–52.0)
Hemoglobin: 13.9 g/dL (ref 13.0–17.0)
MCH: 30.1 pg (ref 26.0–34.0)
MCHC: 34.3 g/dL (ref 30.0–36.0)
MCV: 87.7 fL (ref 80.0–100.0)
Platelets: 242 10*3/uL (ref 150–400)
RBC: 4.62 MIL/uL (ref 4.22–5.81)
RDW: 12.5 % (ref 11.5–15.5)
WBC: 15.5 10*3/uL — ABNORMAL HIGH (ref 4.0–10.5)
nRBC: 0 % (ref 0.0–0.2)

## 2018-07-29 LAB — BASIC METABOLIC PANEL
Anion gap: 13 (ref 5–15)
BUN: 5 mg/dL — ABNORMAL LOW (ref 6–20)
CO2: 22 mmol/L (ref 22–32)
Calcium: 8.6 mg/dL — ABNORMAL LOW (ref 8.9–10.3)
Chloride: 102 mmol/L (ref 98–111)
Creatinine, Ser: 0.88 mg/dL (ref 0.61–1.24)
GFR calc Af Amer: >60 mL/min (ref >60)
GFR calc non Af Amer: >60 mL/min (ref >60)
Glucose, Bld: 108 mg/dL — ABNORMAL HIGH (ref 70–99)
Potassium: 3.2 mmol/L — ABNORMAL LOW (ref 3.5–5.1)
Sodium: 137 mmol/L (ref 135–145)

## 2018-07-29 LAB — PROCALCITONIN: Procalcitonin: <0.1 ng/mL

## 2018-07-29 LAB — HIV ANTIBODY (ROUTINE TESTING W REFLEX): HIV Screen 4th Generation wRfx: NONREACTIVE

## 2018-07-29 LAB — MAGNESIUM: Magnesium: 1.8 mg/dL (ref 1.7–2.4)

## 2018-07-29 MED ORDER — PIPERACILLIN-TAZOBACTAM 3.375 G IVPB
3.3750 g | Freq: Three times a day (TID) | INTRAVENOUS | Status: DC
  Administered 2018-07-29 – 2018-07-30 (×2): 3.375 g via INTRAVENOUS
  Filled 2018-07-29 (×3): qty 50

## 2018-07-29 MED ORDER — POTASSIUM CHLORIDE 10 MEQ/100ML IV SOLN
10.0000 meq | INTRAVENOUS | Status: AC
  Administered 2018-07-29 (×4): 10 meq via INTRAVENOUS
  Filled 2018-07-29 (×4): qty 100

## 2018-07-29 MED ORDER — POTASSIUM CHLORIDE 10 MEQ/100ML IV SOLN
10.0000 meq | Freq: Once | INTRAVENOUS | Status: AC
  Administered 2018-07-29: 10 meq via INTRAVENOUS
  Filled 2018-07-29: qty 100

## 2018-07-29 NOTE — Progress Notes (Signed)
PROGRESS NOTE  Jay Davis IFO:[id number] DOB: 03/01/1981 DOA: 07/28/2018 PCP: Patient, No Pcp Per   LOS: 1 day   Patient is from: Home  Brief Narrative / Interim history: 38 year old male with history of asthma, depression, bipolar disorder, former smoker, marijuana abuse presenting with abdominal pain and found to have acute diverticulitis without abscess or perforation.  In ED, febrile to 102.7 and tachycardic.  Otherwise, hemodynamically stable.  CBC with mild leukocytosis to 14.  Lactic acid, lipase, urinalysis and CMP not impressive.  COVID-19 negative.  CT abdomen and pelvis revealed acute diverticulitis of left colon with surrounding edema but no abscess or perforation.  Admitted for acute diverticulitis on IV Flagyl and Cipro  Subjective: No major events overnight of this morning.  Continues to endorse LLQ pain but improved from admission.  Denies chest pain, dyspnea or dysuria.  Has not had bowel movement yet.  Denies melena or hematochezia.  Assessment & Plan: Sepsis due to acute diverticulitis: present on admission -CT abdomen revealed acute diverticulitis of left colon without abscess or perforation. -Cipro and Flagyl 5/13-5/14 -Changing antibiotic to Zosyn 5/14--.  Pharmacy concerned about Cipro given his psych history. -Clear liquid diet -Blood cultures negative so far -Pain management -May need outpatient GI follow-up for colonoscopy in 3 to 4 weeks.  Hypokalemia: Likely due to IV fluids -Discontinue IV fluid -Replenish and recheck -Check magnesium  History of depression/bipolar disorder: Stable.  Does not appear to be medication at home -We will monitor.  History of asthma: Stable -Breathing treatment as needed  Scheduled Meds: Continuous Infusions: . sodium chloride 125 mL/hr at 07/29/18 1110  . piperacillin-tazobactam (ZOSYN)  IV     PRN Meds:.acetaminophen **OR** acetaminophen, albuterol, morphine injection, ondansetron (ZOFRAN) IV,  oxyCODONE-acetaminophen   DVT prophylaxis: SCD Code Status: Full code Family Communication: Updated patient's wife over the phone Disposition Plan: Remains inpatient on IV antibiotic pending clinical improvement on p.o. tolerance  Consultants:   None  Procedures:   None  Microbiology: . Blood cultures negative . COVID-19 negative  Antimicrobials: Anti-infectives (From admission, onward)   Start     Dose/Rate Route Frequency Ordered Stop   07/29/18 2000  piperacillin-tazobactam (ZOSYN) IVPB 3.375 g     3.375 g 12.5 mL/hr over 240 Minutes Intravenous Every 8 hours 07/29/18 1328     07/29/18 0700  ciprofloxacin (CIPRO) IVPB 400 mg  Status:  Discontinued     400 mg 200 mL/hr over 60 Minutes Intravenous Every 12 hours 07/28/18 2215 07/29/18 1327   07/29/18 0400  metroNIDAZOLE (FLAGYL) IVPB 500 mg  Status:  Discontinued     500 mg 100 mL/hr over 60 Minutes Intravenous Every 8 hours 07/28/18 2146 07/29/18 1328   07/28/18 1845  ciprofloxacin (CIPRO) IVPB 400 mg     400 mg 200 mL/hr over 60 Minutes Intravenous  Once 07/28/18 1835 07/28/18 2032   07/28/18 1845  metroNIDAZOLE (FLAGYL) IVPB 500 mg     500 mg 100 mL/hr over 60 Minutes Intravenous  Once 07/28/18 1835 07/28/18 2032      Objective: Vitals:   07/28/18 2123 07/29/18 0428 07/29/18 0813 07/29/18 1204  BP: 114/78 111/75 (!) 100/57 115/72  Pulse: (!) 101 (!) 108 88 96  Resp: 18 18  18   Temp: 99.9 F (37.7 C) (!) 100.6 F (38.1 C) (!) 97.5 F (36.4 C) 98.7 F (37.1 C)  TempSrc: Oral Oral Oral Oral  SpO2: 100% 100% 100% 100%  Weight: 78.4 kg 77.9 kg    Height: 5\' 4"  (1.626 m)  Intake/Output Summary (Last 24 hours) at 07/29/2018 1333 Last data filed at 07/29/2018 1610 Gross per 24 hour  Intake 1300 ml  Output 2175 ml  Net -875 ml   Filed Weights   07/28/18 1618 07/28/18 2123 07/29/18 0428  Weight: 79.8 kg 78.4 kg 77.9 kg    Examination:  GENERAL: No acute distress.  Appears well.  HEENT: MMM.   Vision and hearing grossly intact.  NECK: Supple.  No JVD.  LUNGS:  No IWOB. Good air movement bilaterally. HEART:  RRR. Heart sounds normal.  ABD: Bowel sounds present. Soft.  Tenderness to palpation over LLQ MSK/EXT:  Moves all extremities. No apparent deformity. No edema bilaterally.  SKIN: no apparent skin lesion or wound NEURO: Awake, alert and oriented appropriately.  No gross deficit.  PSYCH: Calm. Normal affect.    Data Reviewed: I have independently reviewed following labs and imaging studies  CBC: Recent Labs  Lab 07/28/18 1700 07/29/18 0559  WBC 13.9* 15.5*  NEUTROABS 10.9*  --   HGB 14.2 13.9  HCT 43.1 40.5  MCV 89.8 87.7  PLT 234 242   Basic Metabolic Panel: Recent Labs  Lab 07/28/18 1700 07/29/18 0559  NA 135 137  K 3.7 3.2*  CL 103 102  CO2 23 22  GLUCOSE 108* 108*  BUN 10 5*  CREATININE 0.63 0.88  CALCIUM 8.9 8.6*  MG  --  1.8   GFR: Estimated Creatinine Clearance: 107.4 mL/min (by C-G formula based on SCr of 0.88 mg/dL). Liver Function Tests: Recent Labs  Lab 07/28/18 1700  AST 21  ALT 20  ALKPHOS 60  BILITOT 0.5  PROT 7.2  ALBUMIN 3.9   Recent Labs  Lab 07/28/18 1700  LIPASE 26   No results for input(s): AMMONIA in the last 168 hours. Coagulation Profile: No results for input(s): INR, PROTIME in the last 168 hours. Cardiac Enzymes: No results for input(s): CKTOTAL, CKMB, CKMBINDEX, TROPONINI in the last 168 hours. BNP (last 3 results) No results for input(s): PROBNP in the last 8760 hours. HbA1C: No results for input(s): HGBA1C in the last 72 hours. CBG: No results for input(s): GLUCAP in the last 168 hours. Lipid Profile: No results for input(s): CHOL, HDL, LDLCALC, TRIG, CHOLHDL, LDLDIRECT in the last 72 hours. Thyroid Function Tests: No results for input(s): TSH, T4TOTAL, FREET4, T3FREE, THYROIDAB in the last 72 hours. Anemia Panel: No results for input(s): VITAMINB12, FOLATE, FERRITIN, TIBC, IRON, RETICCTPCT in the last  72 hours. Urine analysis:    Component Value Date/Time   COLORURINE YELLOW 07/28/2018 1641   APPEARANCEUR CLEAR 07/28/2018 1641   LABSPEC 1.025 07/28/2018 1641   PHURINE 6.0 07/28/2018 1641   GLUCOSEU NEGATIVE 07/28/2018 1641   HGBUR TRACE (A) 07/28/2018 1641   BILIRUBINUR NEGATIVE 07/28/2018 1641   KETONESUR NEGATIVE 07/28/2018 1641   PROTEINUR NEGATIVE 07/28/2018 1641   NITRITE NEGATIVE 07/28/2018 1641   LEUKOCYTESUR NEGATIVE 07/28/2018 1641   Sepsis Labs: Invalid input(s): PROCALCITONIN, LACTICIDVEN  Recent Results (from the past 240 hour(s))  Blood culture (routine x 2)     Status: None (Preliminary result)   Collection Time: 07/28/18  7:00 PM  Result Value Ref Range Status   Specimen Description   Final    BLOOD RIGHT ANTECUBITAL Performed at [facility], [address] [place], [place] [address]    Special Requests   Final    BOTTLES DRAWN AEROBIC AND ANAEROBIC Blood Culture adequate volume Performed at [facility], [address] [place], [place] [address]    Culture   Final    NO GROWTH < 24 HOURS Performed at [facility], [address] [place], [place] [address]    Report Status PENDING  Incomplete  Blood culture (routine x 2)     Status: None (Preliminary result)   Collection Time: 07/28/18  7:05 PM  Result Value Ref Range Status   Specimen Description   Final    BLOOD BLOOD RIGHT FOREARM Performed at [facility], [address] [place], [place] [address]    Special Requests   Final    BOTTLES DRAWN AEROBIC AND ANAEROBIC Blood Culture adequate volume Performed at [facility], [address] [place], [place] [address]    Culture   Final    NO GROWTH < 24 HOURS Performed at [facility], [address], [place], [place] [address]    Report Status PENDING  Incomplete  SARS Coronavirus 2 (Hosp order,Performed in [facility] lab via [employer])     Status: None   Collection Time: 07/28/18  7:10  PM  Result Value Ref Range Status   SARS Coronavirus 2 ([employer] Now) NEGATIVE NEGATIVE Final    Comment: (NOTE) Interpretive Result Comment(s): COVID 19 Positive SARS CoV 2 target nucleic acids are DETECTED. The SARS CoV 2 RNA is generally detectable in upper and lower respiratory specimens during the acute phase of infection.  Positive results are indicative of active infection with SARS CoV 2.  Clinical correlation with patient history and other diagnostic information is necessary to determine patient infection status.  Positive results do not rule out bacterial infection or coinfection with other viruses. The expected result is Negative. COVID 19 Negative SARS CoV 2 target nucleic acids are NOT DETECTED. The SARS CoV 2 RNA is generally detectable in upper and lower respiratory specimens during the acute phase of infection.  Negative results do not preclude SARS CoV 2 infection, do not rule out coinfections with other pathogens, and should not be used as the sole basis for treatment or other patient management decisions.  Negative results must be combined with clinical  observations, patient history, and epidemiological information. The expected result is Negative. Invalid Presence or absence of SARS CoV 2 nucleic acids cannot be determined. Repeat testing was performed on the submitted specimen and repeated Invalid results were obtained.  If clinically indicated, additional testing on a new specimen with an alternate test methodology [phone]) is advised.  The SARS CoV 2 RNA is generally detectable in upper and lower respiratory specimens during the acute phase of infection. The expected result is Negative. Fact Sheet for Patients:  http://www.graves-ford.org/ Fact Sheet for Healthcare Providers: EnviroConcern.si This test is not yet approved or cleared by the [place] FDA and has been authorized for detection and/or diagnosis of  SARS CoV 2 by FDA under an Emergency Use Authorization (EUA).  This EUA will remain in effect (meaning this test can be used) for the duration of the COVID19 d eclaration under Section 564(b)(1) of the Act, 21 U.S.C. section [phone] 3(b)(1), unless the authorization is terminated or revoked sooner. Performed at [facility], [address] [place], [place] [address]       Radiology Studies: Ct Abdomen Pelvis W Contrast  Result Date: 07/28/2018 CLINICAL DATA:  Left lower quadrant pain EXAM: CT ABDOMEN AND PELVIS WITH CONTRAST TECHNIQUE: Multidetector CT imaging of the abdomen and pelvis was performed using the standard protocol  following bolus administration of intravenous contrast. CONTRAST:  100mL OMNIPAQUE IOHEXOL 300 MG/ML  SOLN COMPARISON:  None. FINDINGS: Lower chest: Lung bases clear bilaterally. Hepatobiliary: No focal liver abnormality is seen. No gallstones, gallbladder wall thickening, or biliary dilatation. Pancreas: Negative Spleen: Negative Adrenals/Urinary Tract: Small nonobstructing calculi right lower pole. No renal obstruction or mass. Normal bladder. Stomach/Bowel: Segmental thickening of the lower left colon with extensive edema in the surrounding mesentery. Diverticula noted in the left colon and sigmoid colon. Findings compatible with diverticulitis without abscess or perforation. Normal appendix. Distended fluid-filled small bowel loops are present in the abdomen most likely ileus. Stool in the colon without dilatation. Vascular/Lymphatic: No significant vascular findings are present. No enlarged abdominal or pelvic lymph nodes. Reproductive: Mild prostate enlargement Other: Negative for hernia Musculoskeletal: Negative for acute abnormality. 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 IMPRESSION: 1. Acute diverticulitis of the left colon with surrounding edema but no abscess or perforation. 2. Nonobstructing small right lower pole renal calculi 3.  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 Electronically Signed   By: Marlan Palauharles  Clark M.D.   On: 07/28/2018 18:26    Raymond Bhardwaj T. [facility] Pager [phone]  If 7PM-7AM, please contact night-coverage [email or web address] Password [email or web address] 07/29/2018, 1:33 PM

## 2018-07-29 NOTE — Plan of Care (Signed)

## 2018-07-29 NOTE — Progress Notes (Signed)
Patient's wife called- wanted an update on plan of care.   Gave update according to MD notes.   Wife asked when patient may go home- I was unsure.  Paged MD - asked to call wife with update at [phone].

## 2018-07-30 ENCOUNTER — Inpatient Hospital Stay (HOSPITAL_COMMUNITY): Payer: Self-pay

## 2018-07-30 LAB — BASIC METABOLIC PANEL
Anion gap: 8 (ref 5–15)
BUN: 6 mg/dL (ref 6–20)
CO2: 23 mmol/L (ref 22–32)
Calcium: 8.2 mg/dL — ABNORMAL LOW (ref 8.9–10.3)
Chloride: 107 mmol/L (ref 98–111)
Creatinine, Ser: 0.78 mg/dL (ref 0.61–1.24)
GFR calc Af Amer: >60 mL/min (ref >60)
GFR calc non Af Amer: >60 mL/min (ref >60)
Glucose, Bld: 100 mg/dL — ABNORMAL HIGH (ref 70–99)
Potassium: 3.4 mmol/L — ABNORMAL LOW (ref 3.5–5.1)
Sodium: 138 mmol/L (ref 135–145)

## 2018-07-30 LAB — CBC
HCT: 38 % — ABNORMAL LOW (ref 39.0–52.0)
Hemoglobin: 12.7 g/dL — ABNORMAL LOW (ref 13.0–17.0)
MCH: 29.6 pg (ref 26.0–34.0)
MCHC: 33.4 g/dL (ref 30.0–36.0)
MCV: 88.6 fL (ref 80.0–100.0)
Platelets: 257 K/uL (ref 150–400)
RBC: 4.29 MIL/uL (ref 4.22–5.81)
RDW: 12.3 % (ref 11.5–15.5)
WBC: 12.1 K/uL — ABNORMAL HIGH (ref 4.0–10.5)
nRBC: 0 % (ref 0.0–0.2)

## 2018-07-30 LAB — MAGNESIUM: Magnesium: 2.1 mg/dL (ref 1.7–2.4)

## 2018-07-30 MED ORDER — AMOXICILLIN-POT CLAVULANATE 875-125 MG PO TABS: 1.0000 | ORAL_TABLET | Freq: Two times a day (BID) | ORAL | 0 refills | Status: DC

## 2018-07-30 MED ORDER — KETOROLAC TROMETHAMINE 30 MG/ML IJ SOLN
30.0000 mg | Freq: Three times a day (TID) | INTRAMUSCULAR | Status: DC | PRN
  Administered 2018-07-30: 17:00:00 30 mg via INTRAVENOUS
  Filled 2018-07-30: qty 1

## 2018-07-30 MED ORDER — AMOXICILLIN-POT CLAVULANATE 875-125 MG PO TABS
1.0000 | ORAL_TABLET | Freq: Two times a day (BID) | ORAL | Status: DC
  Administered 2018-07-30: 09:00:00 1 via ORAL
  Filled 2018-07-30: qty 1

## 2018-07-30 MED ORDER — OXYCODONE HCL 5 MG PO TABS
5.0000 mg | ORAL_TABLET | ORAL | Status: AC | PRN
  Administered 2018-07-31 (×3): 10 mg via ORAL
  Filled 2018-07-30 (×3): qty 2

## 2018-07-30 MED ORDER — LACTATED RINGERS IV SOLN
INTRAVENOUS | Status: DC
  Administered 2018-07-30: 18:00:00 via INTRAVENOUS

## 2018-07-30 MED ORDER — KETOROLAC TROMETHAMINE 30 MG/ML IJ SOLN
15.0000 mg | Freq: Three times a day (TID) | INTRAMUSCULAR | Status: AC | PRN
  Administered 2018-07-31: 15 mg via INTRAVENOUS
  Filled 2018-07-30: qty 1

## 2018-07-30 MED ORDER — POLYETHYLENE GLYCOL 3350 17 G PO PACK
17.0000 g | PACK | Freq: Two times a day (BID) | ORAL | Status: AC
  Administered 2018-07-31: 09:00:00 17 g via ORAL
  Filled 2018-07-30 (×2): qty 1

## 2018-07-30 MED ORDER — POTASSIUM CHLORIDE CRYS ER 20 MEQ PO TBCR
40.0000 meq | EXTENDED_RELEASE_TABLET | Freq: Once | ORAL | Status: AC
  Administered 2018-07-30: 40 meq via ORAL
  Filled 2018-07-30: qty 2

## 2018-07-30 MED ORDER — MORPHINE SULFATE (PF) 2 MG/ML IV SOLN
2.0000 mg | INTRAVENOUS | Status: DC | PRN
  Administered 2018-07-30 – 2018-08-01 (×10): 2 mg via INTRAVENOUS
  Filled 2018-07-30 (×10): qty 1

## 2018-07-30 MED ORDER — MORPHINE SULFATE (PF) 4 MG/ML IV SOLN
4.0000 mg | Freq: Once | INTRAVENOUS | Status: AC
  Administered 2018-07-30: 4 mg via INTRAVENOUS
  Filled 2018-07-30: qty 1

## 2018-07-30 MED ORDER — POLYETHYLENE GLYCOL 3350 17 G PO PACK: 17.0000 g | PACK | Freq: Two times a day (BID) | ORAL | Status: DC

## 2018-07-30 MED ORDER — PANTOPRAZOLE SODIUM 40 MG IV SOLR
40.0000 mg | Freq: Two times a day (BID) | INTRAVENOUS | Status: DC
  Administered 2018-07-30: 40 mg via INTRAVENOUS
  Filled 2018-07-30: qty 40

## 2018-07-30 MED ORDER — PIPERACILLIN-TAZOBACTAM 3.375 G IVPB
3.3750 g | Freq: Three times a day (TID) | INTRAVENOUS | Status: DC
  Administered 2018-07-30 – 2018-08-06 (×24): 3.375 g via INTRAVENOUS
  Filled 2018-07-30 (×25): qty 50

## 2018-07-30 MED ORDER — OXYCODONE HCL 5 MG PO TABS
5.0000 mg | ORAL_TABLET | Freq: Four times a day (QID) | ORAL | Status: DC | PRN
  Administered 2018-07-30 (×2): 5 mg via ORAL
  Filled 2018-07-30 (×2): qty 1

## 2018-07-30 MED ORDER — ACETAMINOPHEN 325 MG PO TABS: 650.0000 mg | ORAL_TABLET | Freq: Four times a day (QID) | ORAL | 0 refills | Status: DC | PRN

## 2018-07-30 MED FILL — AMOX-CLAV 875-125 MG TABLET: 875-125 | 10 days supply | Qty: 20 | Fill #0

## 2018-07-30 NOTE — Progress Notes (Signed)
Received a page from patient's nursing that patient's wife would like to MD. Initially called using phone number listed [phone]).  Call answered by someone with male voice.  He put me on hold until he takes phone to Ms. Doyne Keel but hang up few seconds later.  Called again.  The same person answered the phone and told wrong number.  Call for the third time, and he directly went to voicemail.  Patient's wife called again and left another callback number [phone]).  Call this number and she answered.  She says she is worried about her husband that he is in pain and calling her multiple times.  I explained to her about his condition, x-ray finding and the plan.  She voiced understanding and appreciated the call.

## 2018-07-30 NOTE — Progress Notes (Signed)
Patient still c/o abdominal pain second dose of Morphine given per order, abdominal xray done, MD notified again, MD came to the floor to see the paint. See epic for new order, v/s stable. MD called the patient wife, wife did not pick up the phone. Patient is resting comfortably now. Will continue to monitor the patient.

## 2018-07-30 NOTE — Progress Notes (Signed)
PROGRESS NOTE  Jay Davis WUJ:[id number] DOB: 03/14/1981 DOA: 07/28/2018 PCP: Patient, No Pcp Per   LOS: 2 days   Patient is from: Home  Brief Narrative / Interim history: 38 year old male with history of asthma, depression, bipolar disorder, former smoker, marijuana abuse presenting with abdominal pain and found to have acute diverticulitis without abscess or perforation.  In ED, febrile to 102.7 and tachycardic.  Otherwise, hemodynamically stable.  CBC with mild leukocytosis to 14.  Lactic acid, lipase, urinalysis and CMP not impressive.  COVID-19 negative.  CT abdomen and pelvis revealed acute diverticulitis of left colon with surrounding edema but no abscess or perforation.  Admitted for acute diverticulitis on IV Flagyl and Cipro.  Transitioned to IV Zosyn out of concern about his underlying psych issue with Cipro.   Subjective: Patient symptoms improved at this morning.  He had a normal bowel movement last night.  He tolerated clear liquid diet yesterday.  However, patient started having severe LLQ pain after falling liquid diet later this morning.  Was given oxycodone and morphine with no significant improvement.  Assessment & Plan: Sepsis due to acute diverticulitis: present on admission.  Sepsis physiology resolved.  Leukocytosis improved.  Pain improved up until late morning when he started having severe LLQ pain again after full liquid diet. -CT abdomen revealed acute diverticulitis of left colon without abscess or perforation. -Cipro and Flagyl 5/13-5/14 -Changing antibiotic to Zosyn 5/14-- -Clear liquid diet -We will resume IV fluid -Blood cultures negative so far -Pain management with PRN morphine and oxycodone -May need outpatient GI follow-up for colonoscopy in 3 to 4 weeks.  Hypokalemia: Likely due to IV fluids -Replenish and recheck  History of depression/bipolar disorder: Stable.  Does not appear to be medication at home -We will monitor.  History of asthma:  Stable -Breathing treatment as needed  Scheduled Meds: Continuous Infusions: . piperacillin-tazobactam (ZOSYN)  IV 3.375 g (07/30/18 1357)   PRN Meds:.acetaminophen **OR** acetaminophen, albuterol, morphine injection, ondansetron (ZOFRAN) IV, oxyCODONE   DVT prophylaxis: SCD Code Status: Full code Family Communication: Updated patient's wife over the phone Disposition Plan: Remains inpatient on IV antibiotic pending clinical improvement on p.o. tolerance.  Still with significant LLQ pain and tenderness.  Consultants:   None  Procedures:   None  Microbiology: . Blood cultures negative . COVID-19 negative  Antimicrobials: Anti-infectives (From admission, onward)   Start     Dose/Rate Route Frequency Ordered Stop   07/30/18 1400  piperacillin-tazobactam (ZOSYN) IVPB 3.375 g     3.375 g 12.5 mL/hr over 240 Minutes Intravenous Every 8 hours 07/30/18 1322     07/30/18 1000  amoxicillin-clavulanate (AUGMENTIN) 875-125 MG per tablet 1 tablet  Status:  Discontinued     1 tablet Oral Every 12 hours 07/30/18 0820 07/30/18 1312   07/30/18 0000  amoxicillin-clavulanate (AUGMENTIN) 875-125 MG tablet     1 tablet Oral Every 12 hours 07/30/18 0913     07/29/18 2000  piperacillin-tazobactam (ZOSYN) IVPB 3.375 g  Status:  Discontinued     3.375 g 12.5 mL/hr over 240 Minutes Intravenous Every 8 hours 07/29/18 1328 07/30/18 0820   07/29/18 0700  ciprofloxacin (CIPRO) IVPB 400 mg  Status:  Discontinued     400 mg 200 mL/hr over 60 Minutes Intravenous Every 12 hours 07/28/18 2215 07/29/18 1327   07/29/18 0400  metroNIDAZOLE (FLAGYL) IVPB 500 mg  Status:  Discontinued     500 mg 100 mL/hr over 60 Minutes Intravenous Every 8 hours 07/28/18 2146 07/29/18 1328  07/28/18 1845  ciprofloxacin (CIPRO) IVPB 400 mg     400 mg 200 mL/hr over 60 Minutes Intravenous  Once 07/28/18 1835 07/28/18 2032   07/28/18 1845  metroNIDAZOLE (FLAGYL) IVPB 500 mg     500 mg 100 mL/hr over 60 Minutes Intravenous   Once 07/28/18 1835 07/28/18 2032      Objective: Vitals:   07/30/18 0017 07/30/18 0427 07/30/18 1159 07/30/18 1350  BP:  105/75 116/80 (!) 139/101  Pulse:  78 82 70  Resp:  18 17   Temp:  98.8 F (37.1 C) 97.9 F (36.6 C) 98.6 F (37 C)  TempSrc:  Oral Oral Oral  SpO2:  98% 100% 100%  Weight: 76.7 kg     Height:        Intake/Output Summary (Last 24 hours) at 07/30/2018 1418 Last data filed at 07/30/2018 0851 Gross per 24 hour  Intake 2395.59 ml  Output 3075 ml  Net -679.41 ml   Filed Weights   07/28/18 2123 07/29/18 0428 07/30/18 0017  Weight: 78.4 kg 77.9 kg 76.7 kg    Examination:  GENERAL: Appears to be in some distress from pain. HEENT: MMM.  Vision and hearing grossly intact.  NECK: Supple.  No apparent JVD. LUNGS:  No IWOB. Good air movement bilaterally. HEART:  RRR. Heart sounds normal.  ABD: Bowel sounds present. Soft.  Significant LLQ tenderness.  No significant rebound or guarding. MSK/EXT:  Moves all extremities. No apparent deformity. No edema bilaterally.  SKIN: no apparent skin lesion or wound NEURO: Awake, alert and oriented appropriately.  No gross deficit.   Data Reviewed: I have independently reviewed following labs and imaging studies  CBC: Recent Labs  Lab 07/28/18 1700 07/29/18 0559 07/30/18 0422  WBC 13.9* 15.5* 12.1*  NEUTROABS 10.9*  --   --   HGB 14.2 13.9 12.7*  HCT 43.1 40.5 38.0*  MCV 89.8 87.7 88.6  PLT 234 242 257   Basic Metabolic Panel: Recent Labs  Lab 07/28/18 1700 07/29/18 0559 07/30/18 0422  NA 135 137 138  K 3.7 3.2* 3.4*  CL 103 102 107  CO2 23 22 23   GLUCOSE 108* 108* 100*  BUN 10 5* 6  CREATININE 0.63 0.88 0.78  CALCIUM 8.9 8.6* 8.2*  MG  --  1.8 2.1   GFR: Estimated Creatinine Clearance: 117.2 mL/min (by C-G formula based on SCr of 0.78 mg/dL). Liver Function Tests: Recent Labs  Lab 07/28/18 1700  AST 21  ALT 20  ALKPHOS 60  BILITOT 0.5  PROT 7.2  ALBUMIN 3.9   Recent Labs  Lab 07/28/18  1700  LIPASE 26   No results for input(s): AMMONIA in the last 168 hours. Coagulation Profile: No results for input(s): INR, PROTIME in the last 168 hours. Cardiac Enzymes: No results for input(s): CKTOTAL, CKMB, CKMBINDEX, TROPONINI in the last 168 hours. BNP (last 3 results) No results for input(s): PROBNP in the last 8760 hours. HbA1C: No results for input(s): HGBA1C in the last 72 hours. CBG: No results for input(s): GLUCAP in the last 168 hours. Lipid Profile: No results for input(s): CHOL, HDL, LDLCALC, TRIG, CHOLHDL, LDLDIRECT in the last 72 hours. Thyroid Function Tests: No results for input(s): TSH, T4TOTAL, FREET4, T3FREE, THYROIDAB in the last 72 hours. Anemia Panel: No results for input(s): VITAMINB12, FOLATE, FERRITIN, TIBC, IRON, RETICCTPCT in the last 72 hours. Urine analysis:    Component Value Date/Time   COLORURINE YELLOW 07/28/2018 1641   APPEARANCEUR CLEAR 07/28/2018 1641   LABSPEC 1.025  07/28/2018 1641   PHURINE 6.0 07/28/2018 1641   GLUCOSEU NEGATIVE 07/28/2018 1641   HGBUR TRACE (A) 07/28/2018 1641   BILIRUBINUR NEGATIVE 07/28/2018 1641   KETONESUR NEGATIVE 07/28/2018 1641   PROTEINUR NEGATIVE 07/28/2018 1641   NITRITE NEGATIVE 07/28/2018 1641   LEUKOCYTESUR NEGATIVE 07/28/2018 1641   Sepsis Labs: Invalid input(s): PROCALCITONIN, LACTICIDVEN  Recent Results (from the past 240 hour(s))  Blood culture (routine x 2)     Status: None (Preliminary result)   Collection Time: 07/28/18  7:00 PM  Result Value Ref Range Status   Specimen Description   Final    BLOOD RIGHT ANTECUBITAL Performed at [facility] [place], [address] [place], [place] [address]    Special Requests   Final    BOTTLES DRAWN AEROBIC AND ANAEROBIC Blood Culture adequate volume Performed at [facility] [place], [address] [place], [place] [address]    Culture   Final    NO GROWTH 2 DAYS Performed at [facility], [address] [place], [place] [address]     Report Status PENDING  Incomplete  Blood culture (routine x 2)     Status: None (Preliminary result)   Collection Time: 07/28/18  7:05 PM  Result Value Ref Range Status   Specimen Description   Final    BLOOD RIGHT FOREARM Performed at [facility], [address] [place], [place] [address]    Special Requests   Final    BOTTLES DRAWN AEROBIC AND ANAEROBIC Blood Culture adequate volume Performed at [facility] [place], [address] [place], [place] [address]    Culture   Final    NO GROWTH 2 DAYS Performed at [facility], [address] [place], [place] [address]    Report Status PENDING  Incomplete  SARS Coronavirus 2 (Hosp order,Performed in [facility] lab via [employer] ID)     Status: None   Collection Time: 07/28/18  7:10 PM  Result Value Ref Range Status   SARS Coronavirus 2 ([employer] ID Now) NEGATIVE NEGATIVE Final    Comment: (NOTE) Interpretive Result Comment(s): COVID 19 Positive SARS CoV 2 target nucleic acids are DETECTED. The SARS CoV 2 RNA is generally detectable in upper and lower respiratory specimens during the acute phase of infection.  Positive results are indicative of active infection with SARS CoV 2.  Clinical correlation with patient history and other diagnostic information is necessary to determine patient infection status.  Positive results do not rule out bacterial infection or coinfection with other viruses. The expected result is Negative. COVID 19 Negative SARS CoV 2 target nucleic acids are NOT DETECTED. The SARS CoV 2 RNA is generally detectable in upper and lower respiratory specimens during the acute phase of infection.  Negative results do not preclude SARS CoV 2 infection, do not rule out coinfections with other pathogens, and should not be used as the sole basis for treatment or other patient management decisions.  Negative results must be combined with clinical  observations, patient history, and epidemiological  information. The expected result is Negative. Invalid Presence or absence of SARS CoV 2 nucleic acids cannot be determined. Repeat testing was performed on the submitted specimen and repeated Invalid results were obtained.  If clinically indicated, additional testing on a new specimen with an alternate test methodology [phone]([id number]) is advised.  The SARS CoV 2 RNA is generally detectable in upper and lower respiratory specimens during the acute phase of infection. The  expected result is Negative. Fact Sheet for Patients:  http://www.graves-ford.org/ Fact Sheet for Healthcare Providers: EnviroConcern.si This test is not yet approved or cleared by the [place] FDA and has been authorized for detection and/or diagnosis of SARS CoV 2 by FDA under an Emergency Use Authorization (EUA).  This EUA will remain in effect (meaning this test can be used) for the duration of the COVID19 d eclaration under Section 564(b)(1) of the Act, 21 U.S.C. section [phone] 3(b)(1), unless the authorization is terminated or revoked sooner. Performed at [facility], [address] [place], [place] [address]       Radiology Studies: No results found.  Armen Waring T. [facility] Pager [phone]  If 7PM-7AM, please contact night-coverage [email or web address] Password [email or web address] 07/30/2018, 2:18 PM

## 2018-07-30 NOTE — TOC Transition Note (Signed)
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 for primary care; patient qualifies for the [facility] program for medication assistance; discharge prescriptions to be filled by [facility] pharmacy at discharge and delivered to the room prior to dc home.   Final next level of care: Home/Self Care Barriers to Discharge: No Barriers Identified   Patient Goals and CMS Choice Patient states their goals for this hospitalization and ongoing recovery are:: to go home CMS Medicare.gov Compare Post Acute Care list provided to:: Patient Choice offered to / list presented to : NA  Discharge Placement                       Discharge Plan and Services In-house Referral: Financial Counselor Discharge Planning Services: CM Consult, Follow-up appt scheduled, Medication Assistance            DME Arranged: N/A DME Agency: NA       HH Arranged: NA HH Agency: NA        Social Determinants of Health (SDOH) Interventions     Readmission Risk Interventions No flowsheet data found.

## 2018-07-30 NOTE — Progress Notes (Signed)
Pharmacy Antibiotic Note  Jay Davis is a 38 y.o. male admitted on 07/28/2018 with Abdominal pain, fever with acute diverticulitis.  Pharmacy has been consulted for Zosyn  dosing.  CC/HPI: Abdominal pain, fever CT: Acute diverticulitis of the left colon with surrounding edema but no abscess or perforation.  PMH: asthma, depression, bipolar, former smoker, marijuana abuse  ID: Previous Cipro/Flagyl for diverticulitis. LA WNL. PC normal. WBC 13.9>15.5>12.1 today. Scr WNL. Patient has a significant psych history so fell like Zosyn would be a better choice than a FQ with black box warnings about psychological effects.  Antimicrobials this admission: 5/13 cipro>5/14 5/13 flagyl> 5/14 5/14 Zosyn>>     Microbiology results: 5/13 blood x2>> 5/13: COVID negative   Plan: Zosyn 3.375g IV q 8 hrs. Pharmacy will sign off. Please reconsult for further dosing assitance.    Height: 5\' 4"  (162.6 cm) Weight: 169 lb 3.2 oz (76.7 kg) IBW/kg (Calculated) : 59.2  Temp (24hrs), Avg:98.5 F (36.9 C), Min:97.9 F (36.6 C), Max:99 F (37.2 C)  Recent Labs  Lab 07/28/18 1700 07/28/18 1856 07/28/18 2259 07/29/18 0559 07/30/18 0422  WBC 13.9*  --   --  15.5* 12.1*  CREATININE 0.63  --   --  0.88 0.78  LATICACIDVEN  --  1.4 1.4  --   --     Estimated Creatinine Clearance: 117.2 mL/min (by C-G formula based on SCr of 0.78 mg/dL).    Allergies  Allergen Reactions  . 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 Pork-Derived Futures trader S. Merilynn Finland, PharmD, BCPS Clinical Staff Pharmacist Misty Stanley Stillinger 07/30/2018 1:20 PM

## 2018-07-30 NOTE — Progress Notes (Signed)
Patient called crying c/o abdominal pain all over, abdomen look distended , pain meds and iv antibiotic  Given per oreder, MD notified, see epic for new order. Will continue to monitor the patient.

## 2018-07-30 NOTE — Discharge Instructions (Addendum)
Drenaje percutneo de absceso, cuidados posteriores Percutaneous Abscess Drain, Care After Lea esta informacin sobre cmo cuidarse despus del procedimiento. Su mdico tambin podr darle indicaciones ms especficas. Comunquese con su mdico si tiene problemas o preguntas. Qu puedo esperar despus del procedimiento? Despus del procedimiento, es comn [employer] siguientes sntomas:  Una pequea cantidad de moretones y Associate Professor en la zona donde se coloc el tubo de drenaje (catter).  Somnolencia y Management consultant. Esto debera desaparecer una vez que hayan desaparecido los efectos de los medicamentos administrados. Siga estas indicaciones en su casa: Cuidados de la incisin  Siga las indicaciones del mdico acerca del cuidado de la incisin. Haga lo siguiente: ? Lvese las manos con agua y jabn antes de Multimedia programmer las vendas (vendajes). Use desinfectante para manos si no dispone de [place] y [place]. ? Cambie el vendaje como se lo haya indicado el mdico. ? No retire los puntos (suturas), la goma para cerrar la piel o las tiras [place]. Es posible que estos cierres cutneos Conservation officer, nature en la piel durante 2semanas o ms tiempo. Si los bordes de las tiras [address] empiezan a despegarse y Scientific laboratory technician, puede recortar los que estn sueltos. No retire las tiras [employer] por completo a menos que el mdico se lo indique.  Controle todos los das la zona de la incisin para detectar signos de infeccin. Est atento a los siguientes signos: ? Aumento del enrojecimiento, de la hinchazn o del dolor. ? Mayor presencia de lquido o [place]. ? Calor. ? Pus o mal olor. ? Prdida de lquido alrededor del catter (en vez de que el lquido drene a travs del catter). Cuidado del catter   Siga las indicaciones del mdico acerca de cmo vaciar y limpiar el catter y la bolsa de Production manager. Es posible que deba limpiar el catter [employer] para que no se [place].  Si se lo indican, anote la  siguiente informacin cada vez que vace la bolsa: ? La fecha y la hora. ? La cantidad de drenaje. Instrucciones generales  Descanse en su casa durante 1 a 2 das despus del procedimiento. Retome sus actividades normales como se lo haya indicado el mdico.  No tome baos de inmersin, no practique natacin ni use el jacuzzi durante 24horas despus del procedimiento o hasta que el mdico lo autorice.  Tome los medicamentos de venta libre y los recetados solamente como se lo haya indicado el mdico.  Oceanographer a todas las visitas de control como se lo haya indicado el mdico. Esto es importante. Comunquese con un mdico si:  Tiene menos de 44ml de drenaje por da durante 2 a 3 das seguidos, o segn las indicaciones del mdico.  El enrojecimiento, la hinchazn o Chief Technology Officer alrededor de la incisin [place].  Le sale ms lquido o sangre de la zona de la incisin.  La zona de la incisin est caliente al tacto.  Tiene pus o percibe que sale mal olor de la incisin.  Tiene prdida de lquido alrededor del catter (en vez de que el lquido drene a travs del catter).  Tiene fiebre o siente escalofros.  El dolor no mejora con medicamentos. Solicite ayuda de inmediato si:  El catter se Pension scheme manager.  El catter deja de drenar repentinamente.  Le drena sangre junto con el lquido del catter repentinamente.  Se siente mareado o se desmaya.  Presenta una erupcin cutnea.  Tiene nuseas o vmitos.  Tiene dificultad para respirar o Company secretary.  Siente dolor en el pecho.  Tiene problemas visuales o para hablar.  Tiene problemas de equilibrio, o dificultad para mover los brazos o las piernas. Resumen  Es comn tener una pequea cantidad de moretones y Associate Professor en la zona donde se coloc el tubo de drenaje (catter).  Es posible que le pidan que registre la cantidad de drenaje de la bolsa cada vez que la vace.  Siga las indicaciones del mdico acerca de cmo  vaciar y limpiar el catter y la bolsa de Production manager. Esta informacin no tiene Theme park manager el consejo del mdico. Asegrese de hacerle al mdico cualquier pregunta que tenga. Document Released: 07/18/2013 Document Revised: 12/09/2016 Document Reviewed: 12/09/2016 Elsevier Interactive Patient Education  2019 Elsevier Inc.   Drenaje percutneo de absceso Percutaneous Abscess Drain El drenaje percutneo de absceso es la eliminacin de la recoleccin de lquido infectado dentro del cuerpo (absceso). Esto se realiza Orthoptist de una aguja fina debajo de la piel que se inserta en el absceso. Durante el procedimiento, se inserta un tubo pequeo (catter) que se deja colocado durante [employer] a fin de continuar drenando el absceso. Informe al mdico acerca de lo siguiente:  Cualquier alergia que tenga.  Todos los [employer], incluidos vitaminas, hierbas, gotas oftlmicas, cremas y [address] de [address].  Cualquier problema previo que usted o algn miembro de su familia haya tenido con los anestsicos.  Cualquier enfermedad de la sangre que tenga.  Cirugas previas a las que se someti.  Cualquier enfermedad que tenga.  Si est embarazada o podra estarlo.  Antecedentes de consumo de tabaco o tabaquismo. Cules son los riesgos? En general, se trata de un procedimiento seguro. Sin embargo, pueden ocurrir complicaciones, por ejemplo:  Infeccin.  Hemorragia.  Reaccin alrgica a los medicamentos o a los [employer].  Daos a Systems developer u otros rganos.  Obstruccin del catter, lo que requerir Education officer, community de un nuevo catter.  Necesidad de Engineer, building services.  Fallo del procedimiento de drenar por completo el absceso, que requiere un procedimiento quirrgico abierto para Electrical engineer. El procedimiento abierto se realiza a travs de una incisin ms grande. Qu ocurre antes del  procedimiento?  Medicamentos  Consulte al mdico si debe hacer o no lo siguiente: ? Cambiar o suspender los medicamentos que toma habitualmente. Esto es muy importante si toma medicamentos para la diabetes o anticoagulantes. ? Tomar medicamentos como aspirina e ibuprofeno. Estos medicamentos pueden tener un efecto anticoagulante en la [place]. No tome estos medicamentos antes del procedimiento si su mdico le indica que no lo haga. Mantenerse hidratado Siga las indicaciones del mdico acerca de la hidratacin, las cuales pueden incluir lo siguiente:  Hasta 2horas antes del procedimiento, puede beber lquidos transparentes, como agua, jugos frutales transparentes, caf negro y t solo. Restricciones en las comidas y [address] Siga las indicaciones del mdico respecto de las comidas y bebidas, las cuales pueden incluir lo siguiente:  Ocho horas antes del procedimiento, deje de ingerir comidas o alimentos pesados, por ejemplo, carne, alimentos fritos o alimentos grasos.  Seis horas antes del procedimiento, deje de ingerir comidas o alimentos livianos, como tostadas o cereales.  Seis horas antes del procedimiento, deje de beber [place] o bebidas que [employer].  Dos horas antes del procedimiento, deje de beber lquidos transparentes. Instrucciones generales  Haga planes para que una persona lo lleve a su casa desde el hospital o la clnica.  Si se ir a su casa inmediatamente despus del procedimiento, planifique que alguien se quede con usted durante  24horas.  Pueden indicarle anlisis de [place] o anlisis de [place].  Es posible que le apliquen la vacuna antitetnica.  Pueden realizarle estudios de diagnstico por imgenes, como una ecografa, para verificar qu tan grande o profundo es el absceso. Qu ocurre durante el procedimiento?  Para reducir el riesgo de infecciones: ? El equipo mdico se lavar o se desinfectar las manos. ? Le lavarn la piel alrededor del absceso con  jabn. ? Pueden rasurarle el lugar de la ciruga.  Le colocarn un tubo (catter) intravenoso en una de las venas.  Le administrarn un medicamento para adormecer la zona (anestsico local) donde se Product/process development scientistinsertar el catter. La colocacin del catter vara en funcin del lugar en el que se ubique el absceso.  Le administrarn un medicamento para que se relaje (sedante) o uno para que se duerma (anestesia general).  Le harn una pequea incisin en la piel.  Le insertarn una aguja debajo de la piel hasta llegar al absceso. Se usarn imgenes de ecografa, radiografa o exploracin por tomografa computarizada (TC) para ayudar a guiar a la Engineer, drillingaguja hasta el absceso.  Se insertar un catter en la incisin que se mover por debajo de la piel hasta llegar al absceso. Se usarn imgenes para ayudar a guiar al catter [employer] [place] el absceso.  Una vez que el catter est colocado, se extraer la aguja. El catter se Chartered loss adjusterconectar a una bolsa que se encuentra fuera del cuerpo. El Fish farm managercatter permanecer colocado hasta que el lquido deje de drenar y la infeccin desaparezca. Este procedimiento puede variar segn el mdico y el hospital. Ladell HeadsQu ocurre despus del procedimiento?  Le controlarn la presin arterial, la frecuencia cardaca, la frecuencia respiratoria y Air cabin crewel nivel de oxgeno en la sangre hasta que haya desaparecido el efecto de los medicamentos administrados.  Podr sentir dolor o algunas nuseas. Le ofrecern medicamentos para ayudarlo. Resumen  Un absceso es una coleccin de lquido infectado dentro del cuerpo.  Durante el procedimiento, se usan imgenes de [place]cree[place]cografa, radiografa o exploracin por tomografa computarizada (TC) para ayudar a guiar a la aguja y el catter [employer] [place] el absceso.  El catter se dejar colocado para continuar drenando el absceso despus del procedimiento. Esta informacin no tiene Theme park managercomo fin reemplazar el consejo del mdico. Asegrese de hacerle al mdico cualquier pregunta que  tenga. Document Released: 07/18/2013 Document Revised: 12/09/2016 Document Reviewed: 12/09/2016 Elsevier Interactive Patient Education  2019 Elsevier Inc. Diverticulitis  Diverticulitis is when small pockets in your large intestine (colon) get infected or swollen. This causes stomach pain and watery poop (diarrhea). These pouches are called diverticula. They form in people who have a condition called diverticulosis. Follow these instructions at home: Medicines  Take over-the-counter and prescription medicines only as told by your doctor. These include: ? Antibiotics. ? Pain medicines. ? Fiber pills. ? Probiotics. ? Stool softeners.  Do not drive or use heavy machinery while taking prescription pain medicine.  If you were prescribed an antibiotic, take it as told. Do not stop taking it even if you feel better. General instructions   Follow a diet as told by your doctor.  When you feel better, your doctor may tell you to change your diet. You may need to eat a lot of fiber. Fiber makes it easier to poop (have bowel movements). Healthy foods with fiber include: ? Berries. ? Beans. ? Lentils. ? Green vegetables.  Exercise 3 or more times a week. Aim for 30 minutes each time. Exercise enough to sweat and make your heart beat faster.  Keep all follow-up visits as told. This is important. You may need to have an exam of the large intestine. This is called a colonoscopy. Contact a doctor if:  Your pain does not get better.  You have a hard time eating or drinking.  You are not pooping like normal. Get help right away if:  Your pain gets worse.  Your problems do not get better.  Your problems get worse very fast.  You have a fever.  You throw up (vomit) more than one time.  You have poop that is: ? Bloody. ? Black. ? Tarry. Summary  Diverticulitis is when small pockets in your large intestine (colon) get infected or swollen.  Take medicines only as told by your  doctor.  Follow a diet as told by your doctor. This information is not intended to replace advice given to you by your health care provider. Make sure you discuss any questions you have with your health care provider. Document Released: 08/20/2007 Document Revised: 03/20/2016 Document Reviewed: 03/20/2016 Elsevier Interactive Patient Education  2019 Elsevier Inc.   Diverticulitis Diverticulitis  La diverticulitis ocurre cuando pequeos bolsillos que se han formado en el intestino grueso (colon) se infectan o se inflaman. Esto produce dolor de Teaching laboratory technician y heces lquidas (diarrea). Estas bolsas en el colon se denominan divertculos. Se forman en las personas que tienen una afeccin llamada diverticulitis. Siga estas indicaciones en su casa: Medicamentos  [employer] de venta libre y los recetados solamente como se lo haya indicado el mdico. Estos incluyen los siguientes: ? Antibiticos. ? Analgsicos. ? Pastillas de [place]. ? Probiticos. ? Laxantes.  No conduzca ni use maquinaria pesada mientras toma analgsicos recetados.  Si le recetaron un antibitico, tmelo como se lo hayan indicado. No deje de tomarlos aunque se sienta mejor. Instrucciones generales   Siga la dieta como se lo haya indicado el mdico.  Cuando se sienta mejor, el mdico puede indicarle que cambie la dieta. Tal vez necesite ingerir gran cantidad de fibra. La fibra facilita la evacuacin intestinal (defecacin). Entre los alimentos saludables con [place], se incluyen los siguientes: ? Frutos rojos. ? Frijoles. ? Lentejas. ? Verduras de [employer].  Haga ejercicios 3 o ms veces por semana. Hgalos durante 30 minutos cada vez. Ejerctese lo suficiente como para transpirar y Environmental education officer los latidos cardacos.  Concurra a todas las visitas de control como se lo hayan indicado. Esto es importante. Puede que tenga que someterse a un examen del intestino grueso. Esto se denomina colonoscopia. Comunquese con  un mdico si:  El dolor no mejora.  Le cuesta mucho comer o beber.  No defeca como lo hace normalmente. Solicite ayuda de inmediato si:  El Product/process development scientist.  Los problemas no mejoran.  Los problemas empeoran muy rpidamente.  Tiene fiebre.  Devuelve (vomita) ms de una vez.  Sus heces tienen las siguientes caractersticas: ? Paramedic. ? Son de color negro. ? Son alquitranadas. Resumen  La diverticulitis ocurre cuando pequeos bolsillos que se han formado en el intestino grueso (colon) se infectan o se inflaman.  Tome los medicamentos solamente como se lo haya indicado el mdico.  Siga la dieta como se lo haya indicado el mdico. Esta informacin no tiene Theme park manager el consejo del mdico. Asegrese de hacerle al mdico cualquier pregunta que tenga. Document Released: 02/20/2011 Document Revised: 09/04/2016 Document Reviewed: 09/04/2016 Elsevier Interactive Patient Education  2019 [employer].

## 2018-07-31 ENCOUNTER — Other Ambulatory Visit: Payer: Self-pay

## 2018-07-31 LAB — BASIC METABOLIC PANEL
Anion gap: 14 (ref 5–15)
BUN: 7 mg/dL (ref 6–20)
CO2: 21 mmol/L — ABNORMAL LOW (ref 22–32)
Calcium: 8.8 mg/dL — ABNORMAL LOW (ref 8.9–10.3)
Chloride: 99 mmol/L (ref 98–111)
Creatinine, Ser: 0.89 mg/dL (ref 0.61–1.24)
GFR calc Af Amer: >60 mL/min (ref >60)
GFR calc non Af Amer: >60 mL/min (ref >60)
Glucose, Bld: 127 mg/dL — ABNORMAL HIGH (ref 70–99)
Potassium: 3.4 mmol/L — ABNORMAL LOW (ref 3.5–5.1)
Sodium: 134 mmol/L — ABNORMAL LOW (ref 135–145)

## 2018-07-31 LAB — CBC
HCT: 42.7 % (ref 39.0–52.0)
Hemoglobin: 14.8 g/dL (ref 13.0–17.0)
MCH: 29.9 pg (ref 26.0–34.0)
MCHC: 34.7 g/dL (ref 30.0–36.0)
MCV: 86.3 fL (ref 80.0–100.0)
Platelets: 302 10*3/uL (ref 150–400)
RBC: 4.95 MIL/uL (ref 4.22–5.81)
RDW: 12.2 % (ref 11.5–15.5)
WBC: 10.1 10*3/uL (ref 4.0–10.5)
nRBC: 0 % (ref 0.0–0.2)

## 2018-07-31 MED ORDER — POTASSIUM CHLORIDE IN NACL 20-0.9 MEQ/L-% IV SOLN
INTRAVENOUS | Status: AC
  Administered 2018-07-31 – 2018-08-01 (×2): via INTRAVENOUS
  Filled 2018-07-31 (×3): qty 1000

## 2018-07-31 MED ORDER — POTASSIUM CHLORIDE CRYS ER 20 MEQ PO TBCR
40.0000 meq | EXTENDED_RELEASE_TABLET | Freq: Once | ORAL | Status: AC
  Administered 2018-07-31: 40 meq via ORAL
  Filled 2018-07-31: qty 2

## 2018-07-31 MED ORDER — PANTOPRAZOLE SODIUM 40 MG PO TBEC
40.0000 mg | DELAYED_RELEASE_TABLET | Freq: Two times a day (BID) | ORAL | Status: DC
  Administered 2018-07-31 – 2018-08-01 (×3): 40 mg via ORAL
  Filled 2018-07-31 (×3): qty 1

## 2018-07-31 MED ORDER — SIMETHICONE 40 MG/0.6ML PO SUSP
80.0000 mg | Freq: Three times a day (TID) | ORAL | Status: DC | PRN
  Filled 2018-07-31: qty 1.2

## 2018-07-31 NOTE — Progress Notes (Signed)
MD notified about elevated pulse rate. MEW score of 2. Will monitor.  MD has ordered an EKG

## 2018-07-31 NOTE — Progress Notes (Signed)
EKG complete and reveals Sinus Tach. MD aware.

## 2018-07-31 NOTE — Progress Notes (Signed)
PROGRESS NOTE  Dorna Maiestor Langenfeld ZOX:[id number] DOB: 06/06/1980 DOA: 07/28/2018 PCP: Patient, No Pcp Per   LOS: 3 days   Patient is from: Home  Brief Narrative / Interim history: 38 year old male with history of asthma, depression, bipolar disorder, former smoker, marijuana abuse presenting with abdominal pain and found to have acute diverticulitis without abscess or perforation.  In ED, febrile to 102.7 and tachycardic.  Otherwise, hemodynamically stable.  CBC with mild leukocytosis to 14.  Lactic acid, lipase, urinalysis and CMP not impressive.  COVID-19 negative.  CT abdomen and pelvis revealed acute diverticulitis of left colon with surrounding edema but no abscess or perforation.  Admitted for acute diverticulitis on IV Flagyl and Cipro.  Transitioned to IV Zosyn out of concern about his underlying psych issue with Cipro.  On 5/15, pain improved.  Diet advanced.  Later in the day, patient started having severe LLQ pain.  He was restarted on IV fluid, clear liquid diet and IV Zosyn.  KUB did not reveal significant finding other than gas throughout his bowel.   Subjective: No major events overnight.  Reports passing a lot of gas and burping a lot.  Pain improved but still 6/10 over LLQ.  He had an episode of diarrhea yesterday.  He says he is stool is formed but small.  Denies melena or hematochezia.  Denies chest pain or dyspnea.  Had a fever to 100.4 this morning.  He says he would like to try full liquid diet such as soup today.  Assessment & Plan: Sepsis due to acute diverticulitis: present on admission.  Sepsis physiology resolved.  Leukocytosis improved.  KUB on 5/15 without significant finding.  Pain improved with this morning.  -CT abdomen revealed acute diverticulitis of left colon without abscess or perforation. -Cipro and Flagyl 5/13-5/14 -Changing antibiotic to Zosyn 5/14-- -Full liquid diet today. -Continue IV fluids -PPI, simethicone and bowel regimen -Blood cultures negative so  far -Pain management with PRN morphine and oxycodone -May need outpatient GI follow-up for colonoscopy in 3 to 4 weeks.  Hypokalemia: Likely due to IV fluids -Replenish and recheck  History of depression/bipolar disorder: Stable.  Does not appear to be medication at home -We will monitor.  History of asthma: Stable -Breathing treatment as needed  Scheduled Meds: . pantoprazole  40 mg Oral BID  . polyethylene glycol  17 g Oral BID   Continuous Infusions: . 0.9 % NaCl with KCl 20 mEq / L 75 mL/hr at 07/31/18 0911  . piperacillin-tazobactam (ZOSYN)  IV 3.375 g (07/31/18 0530)   PRN Meds:.acetaminophen **OR** acetaminophen, albuterol, ketorolac, morphine injection, ondansetron (ZOFRAN) IV, oxyCODONE, simethicone   DVT prophylaxis: SCD Code Status: Full code Family Communication: Updated patient's wife over the phone at [phone]. Disposition Plan: Remains inpatient on IV antibiotic pending clinical improvement and p.o. tolerance.  Still with significant LLQ pain and tenderness.  Consultants:   None  Procedures:   None  Microbiology: . Blood cultures negative . COVID-19 negative  Antimicrobials: Anti-infectives (From admission, onward)   Start     Dose/Rate Route Frequency Ordered Stop   07/30/18 1400  piperacillin-tazobactam (ZOSYN) IVPB 3.375 g     3.375 g 12.5 mL/hr over 240 Minutes Intravenous Every 8 hours 07/30/18 1322     07/30/18 1000  amoxicillin-clavulanate (AUGMENTIN) 875-125 MG per tablet 1 tablet  Status:  Discontinued     1 tablet Oral Every 12 hours 07/30/18 0820 07/30/18 1312   07/30/18 0000  amoxicillin-clavulanate (AUGMENTIN) 875-125 MG tablet     1  tablet Oral Every 12 hours 07/30/18 0913     07/29/18 2000  piperacillin-tazobactam (ZOSYN) IVPB 3.375 g  Status:  Discontinued     3.375 g 12.5 mL/hr over 240 Minutes Intravenous Every 8 hours 07/29/18 1328 07/30/18 0820   07/29/18 0700  ciprofloxacin (CIPRO) IVPB 400 mg  Status:  Discontinued      400 mg 200 mL/hr over 60 Minutes Intravenous Every 12 hours 07/28/18 2215 07/29/18 1327   07/29/18 0400  metroNIDAZOLE (FLAGYL) IVPB 500 mg  Status:  Discontinued     500 mg 100 mL/hr over 60 Minutes Intravenous Every 8 hours 07/28/18 2146 07/29/18 1328   07/28/18 1845  ciprofloxacin (CIPRO) IVPB 400 mg     400 mg 200 mL/hr over 60 Minutes Intravenous  Once 07/28/18 1835 07/28/18 2032   07/28/18 1845  metroNIDAZOLE (FLAGYL) IVPB 500 mg     500 mg 100 mL/hr over 60 Minutes Intravenous  Once 07/28/18 1835 07/28/18 2032      Objective: Vitals:   07/30/18 1350 07/30/18 1823 07/30/18 1930 07/31/18 0517  BP: (!) 139/101 139/80 129/81 129/79  Pulse: 70 91 98 (!) 112  Resp:   18   Temp: 98.6 F (37 C)  99.8 F (37.7 C) (!) 100.4 F (38 C)  TempSrc: Oral  Oral Oral  SpO2: 100% 99% 98% 98%  Weight:    75.8 kg  Height:        Intake/Output Summary (Last 24 hours) at 07/31/2018 1215 Last data filed at 07/31/2018 0951 Gross per 24 hour  Intake 1060 ml  Output 175 ml  Net 885 ml   Filed Weights   07/29/18 0428 07/30/18 0017 07/31/18 0517  Weight: 77.9 kg 76.7 kg 75.8 kg    Examination: GENERAL: No acute distress.  Appears well.  HEENT: MMM.  Vision and hearing grossly intact.  NECK: Supple.  No apparent JVD. LUNGS:  No IWOB. Good air movement bilaterally. HEART:  RRR. Heart sounds normal.  ABD: Bowel sounds present. Soft.  Tenderness over LLQ.  No rebound or guarding. MSK/EXT:  Moves all extremities. No apparent deformity. No edema bilaterally.  SKIN: no apparent skin lesion or wound NEURO: Awake, alert and oriented appropriately.  No gross deficit.  PSYCH: Calm. Normal affect. Data Reviewed: I have independently reviewed following labs and imaging studies  CBC: Recent Labs  Lab 07/28/18 1700 07/29/18 0559 07/30/18 0422 07/31/18 0446  WBC 13.9* 15.5* 12.1* 10.1  NEUTROABS 10.9*  --   --   --   HGB 14.2 13.9 12.7* 14.8  HCT 43.1 40.5 38.0* 42.7  MCV 89.8 87.7 88.6  86.3  PLT 234 242 257 302   Basic Metabolic Panel: Recent Labs  Lab 07/28/18 1700 07/29/18 0559 07/30/18 0422 07/31/18 0446  NA 135 137 138 134*  K 3.7 3.2* 3.4* 3.4*  CL 103 102 107 99  CO2 21*  GLUCOSE 108* 108* 100* 127*  BUN 10 5* 6 7  CREATININE 0.63 0.88 0.78 0.89  CALCIUM 8.9 8.6* 8.2* 8.8*  MG  --  1.8 2.1  --    GFR: Estimated Creatinine Clearance: 104.7 mL/min (by C-G formula based on SCr of 0.89 mg/dL). Liver Function Tests: Recent Labs  Lab 07/28/18 1700  AST 21  ALT 20  ALKPHOS 60  BILITOT 0.5  PROT 7.2  ALBUMIN 3.9   Recent Labs  Lab 07/28/18 1700  LIPASE 26   No results for input(s): AMMONIA in the last 168 hours. Coagulation Profile: No results  for input(s): INR, PROTIME in the last 168 hours. Cardiac Enzymes: No results for input(s): CKTOTAL, CKMB, CKMBINDEX, TROPONINI in the last 168 hours. BNP (last 3 results) No results for input(s): PROBNP in the last 8760 hours. HbA1C: No results for input(s): HGBA1C in the last 72 hours. CBG: No results for input(s): GLUCAP in the last 168 hours. Lipid Profile: No results for input(s): CHOL, HDL, LDLCALC, TRIG, CHOLHDL, LDLDIRECT in the last 72 hours. Thyroid Function Tests: No results for input(s): TSH, T4TOTAL, FREET4, T3FREE, THYROIDAB in the last 72 hours. Anemia Panel: No results for input(s): VITAMINB12, FOLATE, FERRITIN, TIBC, IRON, RETICCTPCT in the last 72 hours. Urine analysis:    Component Value Date/Time   COLORURINE YELLOW 07/28/2018 1641   APPEARANCEUR CLEAR 07/28/2018 1641   LABSPEC 1.025 07/28/2018 1641   PHURINE 6.0 07/28/2018 1641   GLUCOSEU NEGATIVE 07/28/2018 1641   HGBUR TRACE (A) 07/28/2018 1641   BILIRUBINUR NEGATIVE 07/28/2018 1641   KETONESUR NEGATIVE 07/28/2018 1641   PROTEINUR NEGATIVE 07/28/2018 1641   NITRITE NEGATIVE 07/28/2018 1641   LEUKOCYTESUR NEGATIVE 07/28/2018 1641   Sepsis Labs: Invalid input(s): PROCALCITONIN, LACTICIDVEN  Recent Results  (from the past 240 hour(s))  Blood culture (routine x 2)     Status: None (Preliminary result)   Collection Time: 07/28/18  7:00 PM  Result Value Ref Range Status   Specimen Description   Final    BLOOD RIGHT ANTECUBITAL Performed at [facility], [address] [place], [place] [address]    Special Requests   Final    BOTTLES DRAWN AEROBIC AND ANAEROBIC Blood Culture adequate volume Performed at [facility], [address] [place], [place] [address]    Culture   Final    NO GROWTH 3 DAYS Performed at [facility], [address], [place], [place] [address]    Report Status PENDING  Incomplete  Blood culture (routine x 2)     Status: None (Preliminary result)   Collection Time: 07/28/18  7:05 PM  Result Value Ref Range Status   Specimen Description   Final    BLOOD RIGHT FOREARM Performed at [facility], [address], [place], [place] [address]    Special Requests   Final    BOTTLES DRAWN AEROBIC AND ANAEROBIC Blood Culture adequate volume Performed at [facility], [address] [place], [place] [address]    Culture   Final    NO GROWTH 3 DAYS Performed at [facility], [address], [place], [place] [address]    Report Status PENDING  Incomplete  SARS Coronavirus 2 (Hosp order,Performed in [facility] lab via [employer] [facility])     Status: None   Collection Time: 07/28/18  7:10 PM  Result Value Ref Range Status   SARS Coronavirus 2 ([employer] Now) NEGATIVE NEGATIVE Final    Comment: (NOTE) Interpretive Result Comment(s): COVID 19 Positive SARS CoV 2 target nucleic acids are DETECTED. The SARS CoV 2 RNA is generally detectable in upper and lower respiratory specimens during the acute phase of infection.  Positive results are indicative of active infection with SARS CoV 2.  Clinical correlation with patient history and other diagnostic information is necessary to determine patient infection status.  Positive results do  not rule out bacterial infection or coinfection with other viruses. The expected result is Negative. COVID 19 Negative SARS CoV 2 target nucleic acids are NOT DETECTED. The SARS CoV 2 RNA is  generally detectable in upper and lower respiratory specimens during the acute phase of infection.  Negative results do not preclude SARS CoV 2 infection, do not rule out coinfections with other pathogens, and should not be used as the sole basis for treatment or other patient management decisions.  Negative results must be combined with clinical  observations, patient history, and epidemiological information. The expected result is Negative. Invalid Presence or absence of SARS CoV 2 nucleic acids cannot be determined. Repeat testing was performed on the submitted specimen and repeated Invalid results were obtained.  If clinically indicated, additional testing on a new specimen with an alternate test methodology [phone]) is advised.  The SARS CoV 2 RNA is generally detectable in upper and lower respiratory specimens during the acute phase of infection. The expected result is Negative. Fact Sheet for Patients:  http://www.graves-ford.org/ Fact Sheet for Healthcare Providers: En[employer]Con[employer]si This test is not yet approved or cleared by the [place] FDA and has been authorized for detection and/or diagnosis of SARS CoV 2 by FDA under an Emergency Use Authorization (EUA).  This EUA will remain in effect (meaning this test can be used) for the duration of the COVID19 d eclaration under Section 564(b)(1) of the Act, 21 U.S.C. section 701 013 9876 3(b)(1), unless the authorization is terminated or revoked sooner. Performed at [facility], [address], [place], [place] [address]       Radiology Studies: Dg Abd Portable 1v  Result Date: 07/30/2018 CLINICAL DATA:  Left lower quadrant pain for several days. Diagnosis of diverticulitis  07/28/2018. EXAM: PORTABLE ABDOMEN - 1 VIEW COMPARISON:  None. FINDINGS: The bowel gas pattern is normal. No radio-opaque calculi or other significant radiographic abnormality are seen. IMPRESSION: Negative exam. Electronically Signed   By: Drusilla Kanner M.D.   On: 07/30/2018 16:03    [facility] Pager [phone]  If 7PM-7AM, please contact night-coverage [email or web address] Password [facility] 07/31/2018, 12:15 PM

## 2018-07-31 NOTE — Progress Notes (Signed)
No new orders received at this time for sinus tach. MD aware. No distress noted. Resting in bed.

## 2018-08-01 ENCOUNTER — Inpatient Hospital Stay (HOSPITAL_COMMUNITY): Payer: Self-pay

## 2018-08-01 ENCOUNTER — Encounter (HOSPITAL_COMMUNITY): Payer: Self-pay | Admitting: Radiology

## 2018-08-01 LAB — COMPREHENSIVE METABOLIC PANEL
ALT: 19 U/L (ref 0–44)
AST: 16 U/L (ref 15–41)
Albumin: 2.6 g/dL — ABNORMAL LOW (ref 3.5–5.0)
Alkaline Phosphatase: 53 U/L (ref 38–126)
Anion gap: 14 (ref 5–15)
BUN: 9 mg/dL (ref 6–20)
CO2: 21 mmol/L — ABNORMAL LOW (ref 22–32)
Calcium: 8.8 mg/dL — ABNORMAL LOW (ref 8.9–10.3)
Chloride: 99 mmol/L (ref 98–111)
Creatinine, Ser: 0.74 mg/dL (ref 0.61–1.24)
GFR calc Af Amer: >60 mL/min (ref >60)
GFR calc non Af Amer: >60 mL/min (ref >60)
Glucose, Bld: 150 mg/dL — ABNORMAL HIGH (ref 70–99)
Potassium: 3.7 mmol/L (ref 3.5–5.1)
Sodium: 134 mmol/L — ABNORMAL LOW (ref 135–145)
Total Bilirubin: 0.8 mg/dL (ref 0.3–1.2)
Total Protein: 7 g/dL (ref 6.5–8.1)

## 2018-08-01 LAB — MAGNESIUM: Magnesium: 2 mg/dL (ref 1.7–2.4)

## 2018-08-01 MED ORDER — PANTOPRAZOLE SODIUM 40 MG IV SOLR
40.0000 mg | INTRAVENOUS | Status: DC
  Administered 2018-08-02 – 2018-08-07 (×5): 40 mg via INTRAVENOUS
  Filled 2018-08-01 (×6): qty 40

## 2018-08-01 MED ORDER — OXYCODONE HCL 5 MG PO TABS
5.0000 mg | ORAL_TABLET | ORAL | Status: DC | PRN
  Administered 2018-08-01: 10 mg via ORAL
  Filled 2018-08-01: qty 2

## 2018-08-01 MED ORDER — METOCLOPRAMIDE HCL 5 MG/ML IJ SOLN
10.0000 mg | Freq: Three times a day (TID) | INTRAMUSCULAR | Status: DC
  Administered 2018-08-01 – 2018-08-07 (×19): 10 mg via INTRAVENOUS
  Filled 2018-08-01 (×20): qty 2

## 2018-08-01 MED ORDER — IOHEXOL 300 MG/ML  SOLN
100.0000 mL | Freq: Once | INTRAMUSCULAR | Status: AC | PRN
  Administered 2018-08-01: 13:00:00 100 mL via INTRAVENOUS

## 2018-08-01 MED ORDER — POTASSIUM CHLORIDE IN NACL 20-0.9 MEQ/L-% IV SOLN
INTRAVENOUS | Status: AC
  Administered 2018-08-01 – 2018-08-02 (×2): via INTRAVENOUS
  Filled 2018-08-01: qty 1000

## 2018-08-01 MED ORDER — ONDANSETRON HCL 4 MG/2ML IJ SOLN
4.0000 mg | Freq: Four times a day (QID) | INTRAMUSCULAR | Status: DC | PRN
  Administered 2018-08-01 (×2): 4 mg via INTRAVENOUS
  Filled 2018-08-01: qty 2

## 2018-08-01 MED ORDER — MORPHINE SULFATE (PF) 2 MG/ML IV SOLN
1.0000 mg | INTRAVENOUS | Status: DC | PRN
  Administered 2018-08-01 – 2018-08-05 (×21): 2 mg via INTRAVENOUS
  Filled 2018-08-01 (×24): qty 1

## 2018-08-01 NOTE — Progress Notes (Signed)
Received a call from radiology, Dr. Lyman Bishop stating that patient's CT abdomen showed bowel perforation and SBO. Made patient NPO and consulted surgery, Dr. Andrey Campanile who will see patient. IVF restarted.

## 2018-08-01 NOTE — Consult Note (Signed)
Reason for Consult:diverticulitis with abscesses Referring Physician: Dr Sherald Hess is an 38 y.o. male.  HPI: 38 year old Hispanic male with a history of bipolar 1 disorder who was admitted on the evening of May 13 with uncomplicated diverticulitis of the descending colon now has evidence of perforated diverticulitis with several abscesses and reactive small bowel inflammation and obstruction.  He states prior to this admission he had not had any abdominal pain denies any prior history of diverticulitis.  He states this was the first time he had abdominal pain like this.  In the emergency room he was found to have an white blood cell count of around 14,000 and temperature of 102.7.  He was placed on IV antibiotics and bowel rest.  Over the past several days he was started on liquids.  His heart rate initially improved after resuscitation and initiation of antibiotics and then his heart rate went back up into the low 100s about 24 hours ago it is now normal rate.  He complained of persistent left lower quadrant pain as well as developed nausea and vomiting today.  A repeat CT scan was performed with the results below.  He states that he is still having bad pain in the left lower abdomen.  It really is not any better since admission.  He has been having bowel movements but they are liquid.  He also had nausea and vomiting earlier today.  He was just made n.p.o.  No prior colonoscopy.  No prior abdominal surgery.  He works in [employer]  He states his only past medical history is asthma  Past Medical History:  Diagnosis Date  . Bipolar 1 disorder (HCC)   . Depression     History reviewed. No pertinent surgical history.  History reviewed. No pertinent family history.  Social History:  reports that he has quit smoking. His smoking use included cigarettes. He has never used smokeless tobacco. He reports current alcohol use. He reports current drug use. Drug: Marijuana.  Allergies:   Allergies  Allergen Reactions  . 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 Pork-Derived Products     Medications: I have reviewed the patient's current medications.  Results for orders placed or performed during the hospital encounter of 07/28/18 (from the past 48 hour(s))  Basic metabolic panel     Status: Abnormal   Collection Time: 07/31/18  4:46 AM  Result Value Ref Range   Sodium 134 (L) 135 - 145 mmol/L   Potassium 3.4 (L) 3.5 - 5.1 mmol/L   Chloride 99 98 - 111 mmol/L   CO2 21 (L) 22 - 32 mmol/L   Glucose, Bld 127 (H) 70 - 99 mg/dL   BUN 7 6 - 20 mg/dL   Creatinine, Ser 1.61 0.61 - 1.24 mg/dL   Calcium 8.8 (L) 8.9 - 10.3 mg/dL   GFR calc non Af Amer >60 >60 mL/min   GFR calc Af Amer >60 >60 mL/min   Anion gap 14 5 - 15    Comment: Performed at [facility], [address], [place], [place] [address]  CBC     Status: None   Collection Time: 07/31/18  4:46 AM  Result Value Ref Range   WBC 10.1 4.0 - 10.5 K/uL   RBC 4.95 4.22 - 5.81 MIL/uL   Hemoglobin 14.8 13.0 - 17.0 g/dL   HCT 54.0 98.1 - 19.1 %   MCV 86.3 80.0 - 100.0 fL  MCH 29.9 26.0 - 34.0 pg   MCHC 34.7 30.0 - 36.0 g/dL   RDW 16.112.2 09.611.5 - 04.515.5 %   Platelets 302 150 - 400 K/uL   nRBC 0.0 0.0 - 0.2 %    Comment: Performed at [facility], [address] [place], [place] [address]  Magnesium     Status: None   Collection Time: 08/01/18  6:25 AM  Result Value Ref Range   Magnesium 2.0 1.7 - 2.4 mg/dL    Comment: Performed at [facility], [address] [place], [place] [address]  Comprehensive metabolic panel     Status: Abnormal   Collection Time: 08/01/18  6:25 AM  Result Value Ref Range   Sodium 134 (L) 135 - 145 mmol/L   Potassium 3.7 3.5 - 5.1 mmol/L   Chloride 99 98 - 111 mmol/L   CO2 21 (L) 22 - 32 mmol/L   Glucose, Bld 150 (H) 70 - 99 mg/dL   BUN 9 6 - 20 mg/dL   Creatinine, Ser 8.290.74 0.61 - 1.24 mg/dL   Calcium 8.8 (L) 8.9 - 10.3  mg/dL   Total Protein 7.0 6.5 - 8.1 g/dL   Albumin 2.6 (L) 3.5 - 5.0 g/dL   AST 16 15 - 41 U/L   ALT 19 0 - 44 U/L   Alkaline Phosphatase 53 38 - 126 U/L   Total Bilirubin 0.8 0.3 - 1.2 mg/dL   GFR calc non Af Amer >60 >60 mL/min   GFR calc Af Amer >60 >60 mL/min   Anion gap 14 5 - 15    Comment: Performed at [facility], [address] [place], [place] [address]    Ct Abdomen Pelvis W Contrast  Result Date: 08/01/2018 CLINICAL DATA:  Follow-up diverticulitis. Persistent generalized abdominal pain, nausea and vomiting and fevers. EXAM: CT ABDOMEN AND PELVIS WITH CONTRAST TECHNIQUE: Multidetector CT imaging of the abdomen and pelvis was performed using the standard protocol following bolus administration of intravenous contrast. CONTRAST:  100mL OMNIPAQUE IOHEXOL 300 MG/ML IV. COMPARISON:  07/28/2018. FINDINGS: Lower chest: Minimal linear atelectasis in the RIGHT LOWER LOBE. Vague approximate 5 mm nodule in the posterolateral RIGHT LOWER LOBE (series 4, image 6). Pleural based approximate 7 x 5 mm (6 mm mean) nodule involving the posterolateral LEFT LOWER LOBE. Normal heart size. Hepatobiliary: Liver normal in size and appearance. Gallbladder normal in appearance without calcified gallstones. No biliary ductal dilation. Pancreas: Normal in appearance without evidence of mass, ductal dilation, or inflammation. Spleen: Normal in size and appearance. Adrenals/Urinary Tract: Normal appearing adrenal glands. Contrast material in the renal collecting system obscures the previously identified RIGHT LOWER pole renal calculi. No focal parenchymal abnormality involving either kidney. No ureteral calculi. Urinary bladder decompressed and unremarkable. Stomach/Bowel: Stomach normal in appearance for the degree of distention. Interval development of marked dilation of multiple loops of small bowel throughout the abdomen and UPPER pelvis with a transition point in the LOWER pelvis, likely at the level of the  proximal ileum. The proximal ileal loops are thick-walled with mucosal enhancement, and there is extensive inflammation in the adjacent fat, indicating secondary inflammation from the perforated diverticulitis detailed below. Since the examination 4 days ago, the patient has developed free intraperitoneal air and a small abscess adjacent to the descending colon at the site of diverticulitis, the abscess measuring approximately 4.4 x 2.6 x 3.5 cm. A second abscess is present in the LEFT UPPER pelvis anteriorly adjacent to an inflamed small bowel  loop, measuring approximately 3.0 x 4.5 x 3.4 cm. Phlegmonous changes with extraluminal gas are present in the UPPER and mid LEFT pelvis anteriorly adjacent to the descending colon and proximal sigmoid colon. Vascular/Lymphatic: No visible aortoiliofemoral atherosclerosis. Widely patent visceral arteries. Normal-appearing portal venous and systemic venous systems. No pathologic lymphadenopathy. Reproductive: Prostate gland and seminal vesicles normal in size and appearance for age. Other: None. 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 No acute findings. IMPRESSION: 1. Perforated descending diverticulitis with pneumoperitoneum and at least 2 abscesses in the LEFT side of the pelvis, measured above. 2. Secondary inflammation of the small bowel which is likely the proximal ileum, accounting for a developing partial small bowel obstruction. 3. Nodules involving both lower lobes, measuring approximately 5 mm on the RIGHT and approximately 6 mm on the LEFT. Non-contrast chest CT at 3-6 months is recommended. If the nodules are stable at time of repeat CT, then future CT at 18-24 months (from today's scan) is considered optional for low-risk patients, but is recommended for high-risk patients. This recommendation follows the consensus statement: Guidelines for Management of Incidental Pulmonary Nodules Detected on CT Images: From the  Fleischner Society 2017; Radiology 2017; 284:228-243. I telephoned these critical/emergent results to Dr. Alanda Slim of the hospitalist service at the time of interpretation on 08/01/2018 at 3 o'clock p.m. Electronically Signed   By: Hulan Saas M.D.   On: 08/01/2018 15:02    Review of Systems  Gastrointestinal: Positive for abdominal pain, nausea and vomiting.  Genitourinary: Positive for dysuria.  All other systems reviewed and are negative.  Blood pressure (!) 145/96, pulse 99, temperature 100 F (37.8 C), temperature source Oral, resp. rate 20, height  (1.626 m), weight 76.2 kg, SpO2 97 %. Physical Exam  Vitals reviewed. Constitutional: He is oriented to person, place, and time. He appears well-developed and well-nourished. No distress.  Nontoxic appearing, not ill appearing.   HENT:  Head: Normocephalic and atraumatic.  Right Ear: External ear normal.  Left Ear: External ear normal.  Eyes: Conjunctivae are normal. No scleral icterus.  Neck: Normal range of motion. Neck supple. No tracheal deviation present. No thyromegaly present.  Cardiovascular: Normal rate and normal heart sounds.  Respiratory: Effort normal and breath sounds normal. No stridor. No respiratory distress. He has no wheezes.  GI: Soft. He exhibits distension. There is abdominal tenderness in the periumbilical area, suprapubic area and left lower quadrant. There is no rigidity and no rebound.  Soft, some distension; not rigid; TTP LLQ>suprapubic; +voluntary guarding; no involuntary guarding; definitely no rebound,peritonitis.   Musculoskeletal:        General: No tenderness or edema.  Lymphadenopathy:    He has no cervical adenopathy.  Neurological: He is alert and oriented to person, place, and time. He exhibits normal muscle tone.  Skin: Skin is warm and dry. No rash noted. He is not diaphoretic. No erythema. No pallor.  Psychiatric: He has a normal mood and affect. His behavior is normal. Judgment and  thought content normal.    Assessment/Plan: Perforated descending diverticulitis with 2 intra-abdominal abscesses Ileus/partial small bowel obstruction secondary to above  I do not believe the patient perforated today as evidence of already having formed abscesses.  While there is some free air in the upper abdomen there is no gross free fluid throughout the abdomen or pelvis.  His white blood cell count normalized yesterday.  His heart rate is overall improved compared to yesterday.  He is resting comfortably.  He does not have  a acute abdomen on exam.  Therefore I recommended proceeding with percutaneous placement of 2 drains in the abscesses, bowel rest, continued IV antibiotics  I discussed this with the patient's wife on the phone at the same time I discussed it with the patient.  I drew diagrams of the colon and diverticulitis and the surrounding small bowel inflammation.  We discussed diverticulitis and its overall management.  We discussed options of proceeding to operating room today which would result in colectomy with colostomy.  We discussed why an anastomosis would not be an option in this setting.  We discussed a second option of having radiology evaluate the patient for percutaneous drain placement and thereby hopefully trying to avoid surgery during this admission.  If this is successful then he could discuss "elective "1 stage colectomy in order to prevent recurrent diverticulitis in the future since he has had an episode of complicated diverticulitis.  We also discussed the possibility that even with drain placement he may not progress and may ultimately need colectomy during this admission; but I think drain placement is a reasonable approach since he is not septic and does not have an acute abdomen.  She voiced understanding and would like to proceed with drain placement if the abscesses are amendable to percutaneous drainage  I discussed the case with Dr. Grace Isaac of interventional  radiology he believes both fluid collections are amendable to drain placement.  N.p.o., may have ice chips and some sips of water.  Will place NG tube if has ongoing vomiting  Plan IR drainage in AM  Mary Sella. Andrey Campanile, MD, FACS General, Bariatric, & Minimally Invasive Surgery [facility]   Gaynelle Adu 08/01/2018, 4:13 PM

## 2018-08-01 NOTE — Progress Notes (Signed)
Dr. Andrey Campanile spoke at length to wife of patient regarding abdominal CT results and options for plan of care.  Patient is currently NPO and resting well.  Will continue to monitor and assess.

## 2018-08-01 NOTE — Progress Notes (Signed)
Updated patient over the phone. Could not reach his wife over the phone.

## 2018-08-01 NOTE — Progress Notes (Signed)
Dr. Alanda Slim notified of patient having several episodes of emesis this pm,  Emesis is yellowish/clear. No undigested food.  Would approximate that patient has had approx 300 in emesis. Each time pt has tried to drink fluid he is unable to retain it.

## 2018-08-01 NOTE — Progress Notes (Signed)
PROGRESS NOTE  Marek Nghiem ZOX:[id number] DOB: 12/10/1980 DOA: 07/28/2018 PCP: Patient, No Pcp Per   LOS: 4 days   Patient is from: Home  Brief Narrative / Interim history: 38 year old male with history of asthma, depression, bipolar disorder, former smoker, marijuana abuse presenting with abdominal pain and found to have acute diverticulitis without abscess or perforation.  In ED, febrile to 102.7 and tachycardic.  Otherwise, hemodynamically stable.  CBC with mild leukocytosis to 14.  Lactic acid, lipase, urinalysis and CMP not impressive.  COVID-19 negative.  CT abdomen and pelvis revealed acute diverticulitis of left colon with surrounding edema but no abscess or perforation.  Admitted for acute diverticulitis on IV Flagyl and Cipro.  Transitioned to IV Zosyn out of concern about his underlying psych issue with Cipro.  On 5/15, pain improved.  Diet advanced.  Later in the day, patient started having severe LLQ pain.  He was restarted on IV fluid, clear liquid diet and IV Zosyn.  KUB did not reveal significant finding other than gas throughout his bowel.   Subjective: Continues to endorse severe pain about 9/10 over his LLQ.  He also reports multiple watery bowel movements overnight and emesis this morning.  Emesis and diarrhea nonbloody.  He also reports passing a lot of gas and burping.  He says he hurts after trying the food he was given. Reportedly some concern about secondary gain as patient appears to be stable and talking over the phone when alone in the room.  Assessment & Plan: Sepsis due to acute diverticulitis: present on admission.  Sepsis physiology resolved.  Leukocytosis resolved.  Continues to endorse significant pain about 9/10.  Tender over LLQ.  Also emesis and diarrhea but he was also given MiraLAX x1 for possible overflow incontinence. -CT abdomen on admission revealed acute diverticulitis of left colon without abscess or perforation. -KUB on 5/15 without significant  finding. -Cipro and Flagyl 5/13-5/14 -Changing antibiotic to Zosyn 5/14-- -continue full liquid diet -Continue IV fluids -PPI, simethicone and bowel regimen -Blood cultures negative so far -Pain management with PRN morphine and oxycodone -Repeat CT abdomen and pelvis -May need outpatient GI follow-up for colonoscopy in 3 to 4 weeks.  Mild hyponatremia: -Continue normal saline.  Hypokalemia: Likely due to IV fluids -Replenish and recheck  History of depression/bipolar disorder: Stable.  Does not appear to be medication at home -We will monitor.  History of asthma: Stable -Breathing treatment as needed  Scheduled Meds: . metoCLOPramide (REGLAN) injection  10 mg Intravenous Q8H  . pantoprazole  40 mg Oral BID   Continuous Infusions: . piperacillin-tazobactam (ZOSYN)  IV 3.375 g (08/01/18 0700)   PRN Meds:.acetaminophen **OR** acetaminophen, albuterol, morphine injection, ondansetron (ZOFRAN) IV, oxyCODONE, simethicone   DVT prophylaxis: SCD Code Status: Full code Family Communication: Updated patient's wife over the phone at [phone] on 5/16 Disposition Plan: Remains inpatient on IV antibiotic pending clinical improvement and p.o. tolerance.  Still with significant LLQ pain, emesis, diarrhea and tenderness on exam.  Consultants:   None  Procedures:   None  Microbiology: . Blood cultures negative . COVID-19 negative  Antimicrobials: Anti-infectives (From admission, onward)   Start     Dose/Rate Route Frequency Ordered Stop   07/30/18 1400  piperacillin-tazobactam (ZOSYN) IVPB 3.375 g     3.375 g 12.5 mL/hr over 240 Minutes Intravenous Every 8 hours 07/30/18 1322     07/30/18 1000  amoxicillin-clavulanate (AUGMENTIN) 875-125 MG per tablet 1 tablet  Status:  Discontinued     1 tablet Oral Every  12 hours 07/30/18 0820 07/30/18 1312   07/30/18 0000  amoxicillin-clavulanate (AUGMENTIN) 875-125 MG tablet     1 tablet Oral Every 12 hours 07/30/18 0913     07/29/18  2000  piperacillin-tazobactam (ZOSYN) IVPB 3.375 g  Status:  Discontinued     3.375 g 12.5 mL/hr over 240 Minutes Intravenous Every 8 hours 07/29/18 1328 07/30/18 0820   07/29/18 0700  ciprofloxacin (CIPRO) IVPB 400 mg  Status:  Discontinued     400 mg 200 mL/hr over 60 Minutes Intravenous Every 12 hours 07/28/18 2215 07/29/18 1327   07/29/18 0400  metroNIDAZOLE (FLAGYL) IVPB 500 mg  Status:  Discontinued     500 mg 100 mL/hr over 60 Minutes Intravenous Every 8 hours 07/28/18 2146 07/29/18 1328   07/28/18 1845  ciprofloxacin (CIPRO) IVPB 400 mg     400 mg 200 mL/hr over 60 Minutes Intravenous  Once 07/28/18 1835 07/28/18 2032   07/28/18 1845  metroNIDAZOLE (FLAGYL) IVPB 500 mg     500 mg 100 mL/hr over 60 Minutes Intravenous  Once 07/28/18 1835 07/28/18 2032      Objective: Vitals:   07/31/18 1956 07/31/18 2137 08/01/18 0336 08/01/18 0600  BP: 127/86 127/86 (!) 143/97   Pulse: (!) 116 (!) 117 (!) 108   Resp: 16  18   Temp: 99.3 F (37.4 C)  97.6 F (36.4 C)   TempSrc: Oral  Oral   SpO2: 98%  97%   Weight:    76.2 kg  Height:        Intake/Output Summary (Last 24 hours) at 08/01/2018 1403 Last data filed at 08/01/2018 1020 Gross per 24 hour  Intake 2482.72 ml  Output 204 ml  Net 2278.72 ml   Filed Weights   07/30/18 0017 07/31/18 0517 08/01/18 0600  Weight: 76.7 kg 75.8 kg 76.2 kg    Examination:  GENERAL: Lying in bed comfortably when I walked in. HEENT: MMM.  Vision and hearing grossly intact.  NECK: Supple.  No apparent JVD. LUNGS:  No IWOB. Good air movement bilaterally. HEART:  RRR. Heart sounds normal.  ABD: Bowel sounds present. Soft.  Tenderness over LLQ.  No rebound or guarding. MSK/EXT:  Moves all extremities. No apparent deformity. No edema bilaterally.  SKIN: no apparent skin lesion or wound NEURO: Awake, alert and oriented appropriately.  No gross deficit.  PSYCH: Calm. Normal affect.  Data Reviewed: I have independently reviewed following labs and  imaging studies  CBC: Recent Labs  Lab 07/28/18 1700 07/29/18 0559 07/30/18 0422 07/31/18 0446  WBC 13.9* 15.5* 12.1* 10.1  NEUTROABS 10.9*  --   --   --   HGB 14.2 13.9 12.7* 14.8  HCT 43.1 40.5 38.0* 42.7  MCV 89.8 87.7 88.6 86.3  PLT 234 242 257 302   Basic Metabolic Panel: Recent Labs  Lab 07/28/18 1700 07/29/18 0559 07/30/18 0422 07/31/18 0446 08/01/18 0625  NA 135 137 138 134* 134*  K 3.7 3.2* 3.4* 3.4* 3.7  CL 103 102 107 99 99  CO2 23 22 23  21* 21*  GLUCOSE 108* 108* 100* 127* 150*  BUN 10 5* 6 7 9   CREATININE 0.63 0.88 0.78 0.89 0.74  CALCIUM 8.9 8.6* 8.2* 8.8* 8.8*  MG  --  1.8 2.1  --  2.0   GFR: Estimated Creatinine Clearance: 116.9 mL/min (by C-G formula based on SCr of 0.74 mg/dL). Liver Function Tests: Recent Labs  Lab 07/28/18 1700 08/01/18 0625  AST 21 16  ALT 20 19  ALKPHOS  60 53  BILITOT 0.5 0.8  PROT 7.2 7.0  ALBUMIN 3.9 2.6*   Recent Labs  Lab 07/28/18 1700  LIPASE 26   No results for input(s): AMMONIA in the last 168 hours. Coagulation Profile: No results for input(s): INR, PROTIME in the last 168 hours. Cardiac Enzymes: No results for input(s): CKTOTAL, CKMB, CKMBINDEX, TROPONINI in the last 168 hours. BNP (last 3 results) No results for input(s): PROBNP in the last 8760 hours. HbA1C: No results for input(s): HGBA1C in the last 72 hours. CBG: No results for input(s): GLUCAP in the last 168 hours. Lipid Profile: No results for input(s): CHOL, HDL, LDLCALC, TRIG, CHOLHDL, LDLDIRECT in the last 72 hours. Thyroid Function Tests: No results for input(s): TSH, T4TOTAL, FREET4, T3FREE, THYROIDAB in the last 72 hours. Anemia Panel: No results for input(s): VITAMINB12, FOLATE, FERRITIN, TIBC, IRON, RETICCTPCT in the last 72 hours. Urine analysis:    Component Value Date/Time   COLORURINE YELLOW 07/28/2018 1641   APPEARANCEUR CLEAR 07/28/2018 1641   LABSPEC 1.025 07/28/2018 1641   PHURINE 6.0 07/28/2018 1641   GLUCOSEU NEGATIVE  07/28/2018 1641   HGBUR TRACE (A) 07/28/2018 1641   BILIRUBINUR NEGATIVE 07/28/2018 1641   KETONESUR NEGATIVE 07/28/2018 1641   PROTEINUR NEGATIVE 07/28/2018 1641   NITRITE NEGATIVE 07/28/2018 1641   LEUKOCYTESUR NEGATIVE 07/28/2018 1641   Sepsis Labs: Invalid input(s): PROCALCITONIN, LACTICIDVEN  Recent Results (from the past 240 hour(s))  Blood culture (routine x 2)     Status: None (Preliminary result)   Collection Time: 07/28/18  7:00 PM  Result Value Ref Range Status   Specimen Description   Final    BLOOD RIGHT ANTECUBITAL Performed at [facility] [place], [address] [place], [place] [address]    Special Requests   Final    BOTTLES DRAWN AEROBIC AND ANAEROBIC Blood Culture adequate volume Performed at [facility] [place], [address] [place], [place] [address]    Culture   Final    NO GROWTH 3 DAYS Performed at [facility], [address] [place], [place] [address]    Report Status PENDING  Incomplete  Blood culture (routine x 2)     Status: None (Preliminary result)   Collection Time: 07/28/18  7:05 PM  Result Value Ref Range Status   Specimen Description   Final    BLOOD RIGHT FOREARM Performed at [facility], [address] [place], [place] [address]    Special Requests   Final    BOTTLES DRAWN AEROBIC AND ANAEROBIC Blood Culture adequate volume Performed at [facility] [place], [address] [place], [place] [address]    Culture   Final    NO GROWTH 3 DAYS Performed at [facility], [address] [place], [place] [address]    Report Status PENDING  Incomplete  SARS Coronavirus 2 (Hosp order,Performed in [facility] lab via [employer])     Status: None   Collection Time: 07/28/18  7:10 PM  Result Value Ref Range Status   SARS Coronavirus 2 ([employer] Now) NEGATIVE NEGATIVE Final    Comment: (NOTE) Interpretive Result Comment(s): COVID 19 Positive SARS CoV 2 target nucleic acids are DETECTED. The SARS CoV 2 RNA  is generally detectable in upper and lower respiratory specimens during the acute phase of infection.  Positive results are indicative of active infection with SARS CoV 2.  Clinical correlation with patient history and other diagnostic information is necessary to  determine patient infection status.  Positive results do not rule out bacterial infection or coinfection with other viruses. The expected result is Negative. COVID 19 Negative SARS CoV 2 target nucleic acids are NOT DETECTED. The SARS CoV 2 RNA is generally detectable in upper and lower respiratory specimens during the acute phase of infection.  Negative results do not preclude SARS CoV 2 infection, do not rule out coinfections with other pathogens, and should not be used as the sole basis for treatment or other patient management decisions.  Negative results must be combined with clinical  observations, patient history, and epidemiological information. The expected result is Negative. Invalid Presence or absence of SARS CoV 2 nucleic acids cannot be determined. Repeat testing was performed on the submitted specimen and repeated Invalid results were obtained.  If clinically indicated, additional testing on a new specimen with an alternate test methodology [phone]) is advised.  The SARS CoV 2 RNA is generally detectable in upper and lower respiratory specimens during the acute phase of infection. The expected result is Negative. Fact Sheet for Patients:  http://www.graves-ford.org/ Fact Sheet for Healthcare Providers: EnviroConcern.si This test is not yet approved or cleared by the [place] FDA and has been authorized for detection and/or diagnosis of SARS CoV 2 by FDA under an Emergency Use Authorization (EUA).  This EUA will remain in effect (meaning this test can be used) for the duration of the COVID19 d eclaration under Section 564(b)(1) of the Act, 21 U.S.C. section  2568018898 3(b)(1), unless the authorization is terminated or revoked sooner. Performed at [facility], [address] [place], [place] [address]       Radiology Studies: No results found.   T. [place] [facility] Pager [phone]  If 7PM-7AM, please contact night-coverage [email or web address] Password [facility] 08/01/2018, 2:03 PM

## 2018-08-01 NOTE — Progress Notes (Signed)
MD aware of temp orally 100.

## 2018-08-01 NOTE — Progress Notes (Signed)
Dr. Alanda Slim notified of oral te,p of 100. MD has consulted surgery to see patient pending results of recent CT of abdomen.   IV fluids resumed as ordered. Will continue to monitor patient.  No emesis for greater than 45 min,

## 2018-08-02 ENCOUNTER — Encounter (HOSPITAL_COMMUNITY): Payer: Self-pay | Admitting: Radiology

## 2018-08-02 ENCOUNTER — Inpatient Hospital Stay (HOSPITAL_COMMUNITY): Payer: Self-pay

## 2018-08-02 DIAGNOSIS — R918 Other nonspecific abnormal finding of lung field: Secondary | ICD-10-CM

## 2018-08-02 LAB — CULTURE, BLOOD (ROUTINE X 2)
Culture: NO GROWTH
Culture: NO GROWTH
Special Requests: ADEQUATE
Special Requests: ADEQUATE

## 2018-08-02 LAB — CBC
HCT: 38.7 % — ABNORMAL LOW (ref 39.0–52.0)
Hemoglobin: 13.1 g/dL (ref 13.0–17.0)
MCH: 29.8 pg (ref 26.0–34.0)
MCHC: 33.9 g/dL (ref 30.0–36.0)
MCV: 88 fL (ref 80.0–100.0)
Platelets: 337 10*3/uL (ref 150–400)
RBC: 4.4 MIL/uL (ref 4.22–5.81)
RDW: 12.2 % (ref 11.5–15.5)
WBC: 14.2 10*3/uL — ABNORMAL HIGH (ref 4.0–10.5)
nRBC: 0 % (ref 0.0–0.2)

## 2018-08-02 LAB — PROTIME-INR
INR: 1.1 (ref 0.8–1.2)
Prothrombin Time: 14.5 seconds (ref 11.4–15.2)

## 2018-08-02 LAB — BASIC METABOLIC PANEL
Anion gap: 10 (ref 5–15)
BUN: 11 mg/dL (ref 6–20)
CO2: 23 mmol/L (ref 22–32)
Calcium: 8.5 mg/dL — ABNORMAL LOW (ref 8.9–10.3)
Chloride: 104 mmol/L (ref 98–111)
Creatinine, Ser: 0.77 mg/dL (ref 0.61–1.24)
GFR calc Af Amer: >60 mL/min (ref >60)
GFR calc non Af Amer: >60 mL/min (ref >60)
Glucose, Bld: 108 mg/dL — ABNORMAL HIGH (ref 70–99)
Potassium: 3.7 mmol/L (ref 3.5–5.1)
Sodium: 137 mmol/L (ref 135–145)

## 2018-08-02 LAB — MAGNESIUM: Magnesium: 2 mg/dL (ref 1.7–2.4)

## 2018-08-02 MED ORDER — FENTANYL CITRATE (PF) 100 MCG/2ML IJ SOLN
INTRAMUSCULAR | Status: AC | PRN
  Administered 2018-08-02: 25 ug via INTRAVENOUS
  Administered 2018-08-02: 50 ug via INTRAVENOUS
  Administered 2018-08-02: 25 ug via INTRAVENOUS

## 2018-08-02 MED ORDER — SODIUM CHLORIDE 0.9 % IV SOLN
INTRAVENOUS | Status: AC | PRN
  Administered 2018-08-02: 10 mL/h via INTRAVENOUS

## 2018-08-02 MED ORDER — FENTANYL CITRATE (PF) 100 MCG/2ML IJ SOLN
INTRAMUSCULAR | Status: AC
  Filled 2018-08-02: qty 2

## 2018-08-02 MED ORDER — SODIUM CHLORIDE 0.9 % IV SOLN
INTRAVENOUS | Status: DC | PRN
  Administered 2018-08-02 – 2018-08-06 (×2): 250 mL via INTRAVENOUS

## 2018-08-02 MED ORDER — SODIUM CHLORIDE 0.9% FLUSH
5.0000 mL | Freq: Three times a day (TID) | INTRAVENOUS | Status: DC
  Administered 2018-08-02 – 2018-08-06 (×13): 5 mL
  Administered 2018-08-06: 18:00:00
  Administered 2018-08-07: 06:00:00 5 mL

## 2018-08-02 MED ORDER — MIDAZOLAM HCL 2 MG/2ML IJ SOLN
INTRAMUSCULAR | Status: AC | PRN
  Administered 2018-08-02: 0.5 mg via INTRAVENOUS
  Administered 2018-08-02: 1 mg via INTRAVENOUS
  Administered 2018-08-02: 0.5 mg via INTRAVENOUS

## 2018-08-02 MED ORDER — MIDAZOLAM HCL 2 MG/2ML IJ SOLN
INTRAMUSCULAR | Status: AC
  Filled 2018-08-02: qty 2

## 2018-08-02 NOTE — Sedation Documentation (Signed)
Pt supine on CT 3 table.  Pt placed on cont cardiac monitoring and 2L O2 via  for procedure

## 2018-08-02 NOTE — Progress Notes (Signed)
Pt wife called and said she has not received any phone calls from MD, I advised per chart that there notes where md has left voicemail. I checked her number updated the cell phone in epic, I paged dr Alanda Slim to advise of correct number

## 2018-08-02 NOTE — Progress Notes (Signed)
Tube secured with sutures (from Dr Lowella Dandy) and by a stat lock device

## 2018-08-02 NOTE — Progress Notes (Signed)
Report from [place] in IR, reports tolerated well and vss, pt received in bed, alert and oriented and says he feels better, drain to LLQ bulb charged, yellow drainage in tubing, dressing c/d/i, pt asking for liquids, paged Md in regards to diet order other than sips and chips

## 2018-08-02 NOTE — Progress Notes (Signed)
Pt has been NPO for IR, pt to IR in stable condition via bed. Voided prior to going to IR

## 2018-08-02 NOTE — Procedures (Signed)
Interventional Radiology Procedure:   Indications: Diverticulitis with abscess and gas collections  Procedure: CT guided drain placement in largest lower abdominal air-fluid pocket.  Findings: Largest abscess pocket along anterior lower abdomen.  10 Fr drain placed and 10 ml of yellow purulent fluid removed.  Multiple additional gas pockets in abdomen.  Complications: None     EBL: Less than 10 ml  Plan: Follow output and send fluid for culture.  Will need short term follow up CT to see what happens to the other gas collections.  Suspect that some of these will organize into abscess collections.   Jay Davis R. Lowella Dandy, MD  Pager: [phone]

## 2018-08-02 NOTE — Consult Note (Signed)
Chief Complaint: Patient was seen in consultation today for abdominal abscess drain(s) placement Chief Complaint  Patient presents with   Abdominal Pain   at the request of Dr Fredonia Highland  Supervising Physician: Richarda Overlie  Patient Status: [facility] - In-pt  History of Present Illness: Jay Davis is a 38 y.o. male   Perforated descending diverticulitis with 2 intra-abdominal abscesses Increasing abd pain  Request for abscess drain (s) per Dr Fredonia Highland  CT yesterday:  IMPRESSION: 1. Perforated descending diverticulitis with pneumoperitoneum and at least 2 abscesses in the LEFT side of the pelvis, measured above. 2. Secondary inflammation of the small bowel which is likely the proximal ileum, accounting for a developing partial small bowel obstruction. 3. Nodules involving both lower lobes, measuring approximately 5 mm on the RIGHT and approximately 6 mm on the LEFT. Non-contrast chest CT at 3-6 months is recommended. If the nodules are stable at time of repeat CT, then future CT at 18-24 months (from today's scan) is considered optional for low-risk patients, but is recommended for high-risk patients.  Discussed with Dr Grace Isaac over weekend He has approved procedure Scheduled for procedure probably today  Past Medical History:  Diagnosis Date   Bipolar 1 disorder (HCC)    Depression     History reviewed. No pertinent surgical history.  Allergies: Ambien [zolpidem tartrate] and Pork-derived products  Medications: Prior to Admission medications   Medication Sig Start Date End Date Taking? Authorizing Provider  acetaminophen (TYLENOL) 325 MG tablet Take 650 mg by mouth every 6 (six) hours as needed.   Yes [provider]  albuterol (PROVENTIL HFA;VENTOLIN HFA) 108 (90 BASE) MCG/ACT inhaler Inhale 1-2 puffs into the lungs every 6 (six) hours as needed for wheezing. 01/04/13  Yes Palumbo, April, MD  ibuprofen (ADVIL) 400 MG tablet Take 400 mg by mouth every 6  (six) hours as needed.   Yes [provider]  loratadine-pseudoephedrine (CLARITIN-D 24 HOUR) 10-240 MG 24 hr tablet Take 1 tablet by mouth daily.   Yes [provider]  acetaminophen (TYLENOL) 325 MG tablet Take 2 tablets (650 mg total) by mouth every 6 (six) hours as needed for mild pain (or Fever >/= 101). 07/30/18   Almon Hercules, MD  amoxicillin-clavulanate (AUGMENTIN) 875-125 MG tablet Take 1 tablet by mouth every 12 (twelve) hours. 07/30/18   Almon Hercules, MD  loratadine (CLARITIN) 10 MG tablet Take 1 tablet (10 mg total) by mouth daily. Patient not taking: Reported on 07/29/2018 01/04/13   Palumbo, April, MD  naproxen (NAPROSYN) 500 MG tablet Take 1 tablet twice daily as needed for toe pain. Patient not taking: Reported on 07/29/2018 09/22/17   Molpus, Jonny Ruiz, MD     History reviewed. No pertinent family history.  Social History   Socioeconomic History   Marital status: Married    Spouse name: Not on file   Number of children: Not on file   Years of education: Not on file   Highest education level: Not on file  Occupational History   Not on file  Social Needs   Financial resource strain: Not on file   Food insecurity:    Worry: Not on file    Inability: Not on file   Transportation needs:    Medical: Not on file    Non-medical: Not on file  Tobacco Use   Smoking status: Former Smoker    Types: Cigarettes   Smokeless tobacco: Never Used  Substance and Sexual Activity  Alcohol use: Yes    Comment: occ   Drug use: Yes    Types: Marijuana   Sexual activity: Not on file  Lifestyle   Physical activity:    Days per week: Not on file    Minutes per session: Not on file   Stress: Not on file  Relationships   Social connections:    Talks on phone: Not on file    Gets together: Not on file    Attends religious service: Not on file    Active member of club or organization: Not on file    Attends meetings of clubs or organizations: Not on file     Relationship status: Not on file  Other Topics Concern   Not on file  Social History Narrative   Not on file    Review of Systems: A 12 point ROS discussed and pertinent positives are indicated in the HPI above.  All other systems are negative.  Review of Systems  Constitutional: Positive for activity change and appetite change. Negative for fatigue and fever.  Respiratory: Negative for shortness of breath.   Cardiovascular: Negative for chest pain.  Gastrointestinal: Positive for abdominal pain and nausea.  Neurological: Negative for weakness.  Psychiatric/Behavioral: Negative for behavioral problems and confusion.    Vital Signs: BP 113/82 (BP Location: Left Arm)    Pulse 91    Temp 98.8 F (37.1 C) (Oral)    Resp 18    Ht 5\' 4"  (1.626 m)    Wt 166 lb 14.4 oz (75.7 kg)    SpO2 99%    BMI 28.65 kg/m   Physical Exam Vitals signs reviewed.  Cardiovascular:     Rate and Rhythm: Normal rate and regular rhythm.  Pulmonary:     Effort: Pulmonary effort is normal.     Breath sounds: Normal breath sounds.  Abdominal:     Tenderness: There is abdominal tenderness.  Skin:    General: Skin is warm and dry.  Neurological:     Mental Status: He is alert and oriented to person, place, and time.  Psychiatric:        Behavior: Behavior normal.     Imaging: Ct Abdomen Pelvis W Contrast  Result Date: 08/01/2018 CLINICAL DATA:  Follow-up diverticulitis. Persistent generalized abdominal pain, nausea and vomiting and fevers. EXAM: CT ABDOMEN AND PELVIS WITH CONTRAST TECHNIQUE: Multidetector CT imaging of the abdomen and pelvis was performed using the standard protocol following bolus administration of intravenous contrast. CONTRAST:  100mL OMNIPAQUE IOHEXOL 300 MG/ML IV. COMPARISON:  07/28/2018. FINDINGS: Lower chest: Minimal linear atelectasis in the RIGHT LOWER LOBE. Vague approximate 5 mm nodule in the posterolateral RIGHT LOWER LOBE (series 4, image 6). Pleural based approximate 7 x  5 mm (6 mm mean) nodule involving the posterolateral LEFT LOWER LOBE. Normal heart size. Hepatobiliary: Liver normal in size and appearance. Gallbladder normal in appearance without calcified gallstones. No biliary ductal dilation. Pancreas: Normal in appearance without evidence of mass, ductal dilation, or inflammation. Spleen: Normal in size and appearance. Adrenals/Urinary Tract: Normal appearing adrenal glands. Contrast material in the renal collecting system obscures the previously identified RIGHT LOWER pole renal calculi. No focal parenchymal abnormality involving either kidney. No ureteral calculi. Urinary bladder decompressed and unremarkable. Stomach/Bowel: Stomach normal in appearance for the degree of distention. Interval development of marked dilation of multiple loops of small bowel throughout the abdomen and UPPER pelvis with a transition point in the LOWER pelvis, likely at the level of the proximal ileum. The  proximal ileal loops are thick-walled with mucosal enhancement, and there is extensive inflammation in the adjacent fat, indicating secondary inflammation from the perforated diverticulitis detailed below. Since the examination 4 days ago, the patient has developed free intraperitoneal air and a small abscess adjacent to the descending colon at the site of diverticulitis, the abscess measuring approximately 4.4 x 2.6 x 3.5 cm. A second abscess is present in the LEFT UPPER pelvis anteriorly adjacent to an inflamed small bowel loop, measuring approximately 3.0 x 4.5 x 3.4 cm. Phlegmonous changes with extraluminal gas are present in the UPPER and mid LEFT pelvis anteriorly adjacent to the descending colon and proximal sigmoid colon. Vascular/Lymphatic: No visible aortoiliofemoral atherosclerosis. Widely patent visceral arteries. Normal-appearing portal venous and systemic venous systems. No pathologic lymphadenopathy. Reproductive: Prostate gland and seminal vesicles normal in size and appearance  for age. Other: None. 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 No acute findings. IMPRESSION: 1. Perforated descending diverticulitis with pneumoperitoneum and at least 2 abscesses in the LEFT side of the pelvis, measured above. 2. Secondary inflammation of the small bowel which is likely the proximal ileum, accounting for a developing partial small bowel obstruction. 3. Nodules involving both lower lobes, measuring approximately 5 mm on the RIGHT and approximately 6 mm on the LEFT. Non-contrast chest CT at 3-6 months is recommended. If the nodules are stable at time of repeat CT, then future CT at 18-24 months (from today's scan) is considered optional for low-risk patients, but is recommended for high-risk patients. This recommendation follows the consensus statement: Guidelines for Management of Incidental Pulmonary Nodules Detected on CT Images: From the Fleischner Society 2017; Radiology 2017; 284:228-243. I telephoned these critical/emergent results to Dr. Alanda Slim of the hospitalist service at the time of interpretation on 08/01/2018 at 3 o'clock p.m. Electronically Signed   By: Hulan Saas M.D.   On: 08/01/2018 15:02   Ct Abdomen Pelvis W Contrast  Result Date: 07/28/2018 CLINICAL DATA:  Left lower quadrant pain EXAM: CT ABDOMEN AND PELVIS WITH CONTRAST TECHNIQUE: Multidetector CT imaging of the abdomen and pelvis was performed using the standard protocol following bolus administration of intravenous contrast. CONTRAST:  OMNIPAQUE IOHEXOL 300 MG/ML  SOLN COMPARISON:  None. FINDINGS: Lower chest: Lung bases clear bilaterally. Hepatobiliary: No focal liver abnormality is seen. No gallstones, gallbladder wall thickening, or biliary dilatation. Pancreas: Negative Spleen: Negative Adrenals/Urinary Tract: Small nonobstructing calculi right lower pole. No renal obstruction or mass. Normal bladder. Stomach/Bowel: Segmental thickening of the lower left colon with  extensive edema in the surrounding mesentery. Diverticula noted in the left colon and sigmoid colon. Findings compatible with diverticulitis without abscess or perforation. Normal appendix. Distended fluid-filled small bowel loops are present in the abdomen most likely ileus. Stool in the colon without dilatation. Vascular/Lymphatic: No significant vascular findings are present. No enlarged abdominal or pelvic lymph nodes. Reproductive: Mild prostate enlargement Other: Negative for hernia Musculoskeletal: Negative for acute abnormality. 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 IMPRESSION: 1. Acute diverticulitis of the left colon with surrounding edema but no abscess or perforation. 2. Nonobstructing small right lower pole renal calculi 3. 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 Electronically Signed   By: Marlan Palau M.D.   On: 07/28/2018 18:26   Dg Abd Portable 1v  Result Date: 07/30/2018 CLINICAL DATA:  Left lower quadrant pain for several days. Diagnosis of diverticulitis 07/28/2018. EXAM: PORTABLE ABDOMEN - 1 VIEW COMPARISON:  None. FINDINGS: The bowel gas pattern is normal. No  radio-opaque calculi or other significant radiographic abnormality are seen. IMPRESSION: Negative exam. Electronically Signed   By: Drusilla Kanner M.D.   On: 07/30/2018 16:03    Labs:  CBC: Recent Labs    07/29/18 0559 07/30/18 0422 07/31/18 0446 08/02/18 0735  WBC 15.5* 12.1* 10.1 14.2*  HGB 13.9 12.7* 14.8 13.1  HCT 40.5 38.0* 42.7 38.7*  PLT 242 257 302 337    COAGS: Recent Labs    08/02/18 0735  INR 1.1    BMP: Recent Labs    07/30/18 0422 07/31/18 0446 08/01/18 0625 08/02/18 0735  NA 138 134* 134* 137  K 3.4* 3.4* 3.7 3.7  CL 107 99 99 104  CO2 23 21* 21* 23  GLUCOSE 100* 127* 150* 108*  BUN CALCIUM 8.2* 8.8* 8.8* 8.5*  CREATININE 0.78 0.89 0.74 0.77  GFRNONAA >60 >60 >60 >60  GFRAA >60 >60 >60 >60    LIVER FUNCTION TESTS: Recent  Labs    07/28/18 1700 08/01/18 0625  BILITOT 0.5 0.8  AST 21 16  ALT 20 19  ALKPHOS 60 53  PROT 7.2 7.0  ALBUMIN 3.9 2.6*    TUMOR MARKERS: No results for input(s): AFPTM, CEA, CA199, CHROMGRNA in the last 8760 hours.  Assessment and Plan:  Diverticular abscess Review shows abscesses-- may need 2 drains Risks and benefits discussed with the patient including bleeding, infection, damage to adjacent structures, bowel perforation/fistula connection, and sepsis.  All of the patient's questions were answered, patient is agreeable to proceed. Consent signed and in chart.  Thank you for this interesting consult.  I greatly enjoyed meeting Raedyn Wenke and look forward to participating in their care.  A copy of this report was sent to the requesting provider on this date.  Electronically Signed: Robet Leu, PA-C 08/02/2018, 10:06 AM   I spent a total of 40 Minutes    in face to face in clinical consultation, greater than 50% of which was counseling/coordinating care for abscess drain (s)

## 2018-08-02 NOTE — Progress Notes (Signed)
Tried to contact patient's wife, Raynelle Highland, for an update.  It went to voicemail.  Letha Cape 12:05 PM 08/02/2018

## 2018-08-02 NOTE — Progress Notes (Signed)
Patient ID: Jay Davis, male   DOB: 11-24-1980, 38 y.o.   MRN: [id number]       Subjective: Patient had about 350cc of emesis yesterday.  No nausea after that but NPO.  Having some diffuse abdominal pain, but a very happy man.   Objective: Vital signs in last 24 hours: Temp:  [98.8 F (37.1 C)-100 F (37.8 C)] 98.8 F (37.1 C) (05/18 0508) Pulse Rate:  [91-99] 91 (05/18 0508) Resp:  [18-20] 18 (05/18 0508) BP: (113-145)/(82-96) 113/82 (05/18 0508) SpO2:  [97 %-100 %] 99 % (05/18 0508) Weight:  [75.7 kg] 75.7 kg (05/18 0508) Last BM Date: 08/01/18  Intake/Output from previous day: 05/17 0701 - 05/18 0700 In: 2200 [P.O.:150; I.V.:1950; IV Piggyback:100] Out: 352 [Emesis/NG output:350; Stool:2] Intake/Output this shift: Total I/O In: 50 [IV Piggyback:50] Out: 0   PE: Heart: regular Lungs: CTAB Abd: soft, but tender in RLQ, LUQ, and greatest in LLQ with some voluntary guarding.  Some BS, doesn't seem distended much.  Lab Results:  Recent Labs    07/31/18 0446  WBC 10.1  HGB 14.8  HCT 42.7  PLT 302   BMET Recent Labs    07/31/18 0446 08/01/18 0625  NA 134* 134*  K 3.4* 3.7  CL 99 99  CO2 21* 21*  GLUCOSE 127* 150*  BUN 7 9  CREATININE 0.89 0.74  CALCIUM 8.8* 8.8*   PT/INR No results for input(s): LABPROT, INR in the last 72 hours. CMP     Component Value Date/Time   NA 134 (L) 08/01/2018 0625   K 3.7 08/01/2018 0625   CL 99 08/01/2018 0625   CO2 21 (L) 08/01/2018 0625   GLUCOSE 150 (H) 08/01/2018 0625   BUN 9 08/01/2018 0625   CREATININE 0.74 08/01/2018 0625   CALCIUM 8.8 (L) 08/01/2018 0625   PROT 7.0 08/01/2018 0625   ALBUMIN 2.6 (L) 08/01/2018 0625   AST 16 08/01/2018 0625   ALT 19 08/01/2018 0625   ALKPHOS 53 08/01/2018 0625   BILITOT 0.8 08/01/2018 0625   GFRNONAA >60 08/01/2018 0625   GFRAA >60 08/01/2018 0625   Lipase     Component Value Date/Time   LIPASE 26 07/28/2018 1700       Studies/Results: Ct Abdomen Pelvis W  Contrast  Result Date: 08/01/2018 CLINICAL DATA:  Follow-up diverticulitis. Persistent generalized abdominal pain, nausea and vomiting and fevers. EXAM: CT ABDOMEN AND PELVIS WITH CONTRAST TECHNIQUE: Multidetector CT imaging of the abdomen and pelvis was performed using the standard protocol following bolus administration of intravenous contrast. CONTRAST:  OMNIPAQUE IOHEXOL 300 MG/ML IV. COMPARISON:  07/28/2018. FINDINGS: Lower chest: Minimal linear atelectasis in the RIGHT LOWER LOBE. Vague approximate 5 mm nodule in the posterolateral RIGHT LOWER LOBE (series 4, image 6). Pleural based approximate 7 x 5 mm (6 mm mean) nodule involving the posterolateral LEFT LOWER LOBE. Normal heart size. Hepatobiliary: Liver normal in size and appearance. Gallbladder normal in appearance without calcified gallstones. No biliary ductal dilation. Pancreas: Normal in appearance without evidence of mass, ductal dilation, or inflammation. Spleen: Normal in size and appearance. Adrenals/Urinary Tract: Normal appearing adrenal glands. Contrast material in the renal collecting system obscures the previously identified RIGHT LOWER pole renal calculi. No focal parenchymal abnormality involving either kidney. No ureteral calculi. Urinary bladder decompressed and unremarkable. Stomach/Bowel: Stomach normal in appearance for the degree of distention. Interval development of marked dilation of multiple loops of small bowel throughout the abdomen and UPPER pelvis with a transition point in the LOWER  pelvis, likely at the level of the proximal ileum. The proximal ileal loops are thick-walled with mucosal enhancement, and there is extensive inflammation in the adjacent fat, indicating secondary inflammation from the perforated diverticulitis detailed below. Since the examination 4 days ago, the patient has developed free intraperitoneal air and a small abscess adjacent to the descending colon at the site of diverticulitis, the abscess  measuring approximately 4.4 x 2.6 x 3.5 cm. A second abscess is present in the LEFT UPPER pelvis anteriorly adjacent to an inflamed small bowel loop, measuring approximately 3.0 x 4.5 x 3.4 cm. Phlegmonous changes with extraluminal gas are present in the UPPER and mid LEFT pelvis anteriorly adjacent to the descending colon and proximal sigmoid colon. Vascular/Lymphatic: No visible aortoiliofemoral atherosclerosis. Widely patent visceral arteries. Normal-appearing portal venous and systemic venous systems. No pathologic lymphadenopathy. Reproductive: Prostate gland and seminal vesicles normal in size and appearance for age. Other: None. 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 No acute findings. IMPRESSION: 1. Perforated descending diverticulitis with pneumoperitoneum and at least 2 abscesses in the LEFT side of the pelvis, measured above. 2. Secondary inflammation of the small bowel which is likely the proximal ileum, accounting for a developing partial small bowel obstruction. 3. Nodules involving both lower lobes, measuring approximately 5 mm on the RIGHT and approximately 6 mm on the LEFT. Non-contrast chest CT at 3-6 months is recommended. If the nodules are stable at time of repeat CT, then future CT at 18-24 months (from today's scan) is considered optional for low-risk patients, but is recommended for high-risk patients. This recommendation follows the consensus statement: Guidelines for Management of Incidental Pulmonary Nodules Detected on CT Images: From the Fleischner Society 2017; Radiology 2017; 284:228-243. I telephoned these critical/emergent results to Dr. Alanda SlimGonfa of the hospitalist service at the time of interpretation on 08/01/2018 at 3 o'clock p.m. Electronically Signed   By: Hulan Saashomas  Lawrence M.D.   On: 08/01/2018 15:02    Anti-infectives: Anti-infectives (From admission, onward)   Start     Dose/Rate Route Frequency Ordered Stop   07/30/18 1400   piperacillin-tazobactam (ZOSYN) IVPB 3.375 g     3.375 g 12.5 mL/hr over 240 Minutes Intravenous Every 8 hours 07/30/18 1322     07/30/18 1000  amoxicillin-clavulanate (AUGMENTIN) 875-125 MG per tablet 1 tablet  Status:  Discontinued     1 tablet Oral Every 12 hours 07/30/18 0820 07/30/18 1312   07/30/18 0000  amoxicillin-clavulanate (AUGMENTIN) 875-125 MG tablet     1 tablet Oral Every 12 hours 07/30/18 0913     07/29/18 2000  piperacillin-tazobactam (ZOSYN) IVPB 3.375 g  Status:  Discontinued     3.375 g 12.5 mL/hr over 240 Minutes Intravenous Every 8 hours 07/29/18 1328 07/30/18 0820   07/29/18 0700  ciprofloxacin (CIPRO) IVPB 400 mg  Status:  Discontinued     400 mg 200 mL/hr over 60 Minutes Intravenous Every 12 hours 07/28/18 2215 07/29/18 1327   07/29/18 0400  metroNIDAZOLE (FLAGYL) IVPB 500 mg  Status:  Discontinued     500 mg 100 mL/hr over 60 Minutes Intravenous Every 8 hours 07/28/18 2146 07/29/18 1328   07/28/18 1845  ciprofloxacin (CIPRO) IVPB 400 mg     400 mg 200 mL/hr over 60 Minutes Intravenous  Once 07/28/18 1835 07/28/18 2032   07/28/18 1845  metroNIDAZOLE (FLAGYL) IVPB 500 mg     500 mg 100 mL/hr over 60 Minutes Intravenous  Once 07/28/18 1835 07/28/18 2032       Assessment/Plan Bipolar  disorder - per medicine  Perforated descending diverticulitis with 2 intra-abdominal abscesses -case d/w Dr. Grace Isaac of IR yesterday by Dr. Andrey Campanile.  Will plan for drainage of both abscesses by IR and see how he does. -if he fails conservative management then he would need a colectomy/colostomy -cont zosyn -cont NPO until pain much improved -labs are all currently pending -cont to closely follow the patient  FEN - IVFs/NPO VTE - may have chemical prophylaxis from our standpoint after IR procedure ID - zosyn 5/15 --> POC - Raynelle Highland, wife, [phone] ( will try and contact a little later today)   LOS: 5 days    Letha Cape , [facility], [place]  Surgery 08/02/2018, 9:13 AM Pager: [phone]

## 2018-08-02 NOTE — Sedation Documentation (Signed)
10cc purulent drainage from new LLQ drain placement

## 2018-08-02 NOTE — Progress Notes (Addendum)
PROGRESS NOTE  Jay Davis ZOX:[id number] DOB: 1980/12/14 DOA: 07/28/2018 PCP: Patient, No Pcp Per   LOS: 5 days   Patient is from: Home  Brief Narrative / Interim history: 38 year old male with history of asthma, depression, bipolar disorder, former smoker, marijuana abuse presenting with abdominal pain and found to have acute diverticulitis without abscess or perforation.  In ED, febrile to 102.7 and tachycardic.  Otherwise, hemodynamically stable.  CBC with mild leukocytosis to 14.  Lactic acid, lipase, urinalysis and CMP not impressive.  COVID-19 negative.  CT abdomen and pelvis revealed acute diverticulitis of left colon with surrounding edema but no abscess or perforation.  Admitted for acute diverticulitis on IV Flagyl and Cipro.  Transitioned to IV Zosyn out of concern about his underlying psych issue with Cipro.  On 5/15, pain improved.  Diet advanced.  Later in the day, patient started having severe LLQ pain.  He was restarted on IV fluid, clear liquid diet and IV Zosyn.  KUB did not reveal significant finding other than gas throughout his bowel.  Patient continued to endorse significant abdominal pain.  On 5/17, he had frequent diarrhea and emesis.  Repeat CT abdomen and pelvis was obtained and showed perforated descending colon diverticulitis with 2 intra-abdominal abscesses and partial SBO.  General surgery was consulted and discussed the case with IR.  The consensus was to proceed with drain placement by IR.  Patient had CT-guided drain placement on 08/02/2018.   Subjective: Feels better today.  Still with some pain over LLQ on the right side.  He rates his pain a 6 out of 10.  No further emesis overnight.  He said he had dark looking diarrhea overnight.  No chest pain, dyspnea or urinary symptoms.  Wife available over the phone during this encounter.  Assessment & Plan: Sepsis due to acute diverticulitis: present on admission. -CT abd/p on 5/13 with uncomplicated acute  diverticulitis of left colon -Repeat CT abdomen and pelvis on 5/17 with perforated descending colon diverticulitis, 2 intra-abdominal abscesses and partial SBO -CT-guided drain placement by IR on 5/18 -General surgery and IR following. -Cipro and Flagyl 5/13-5/14 -Zosyn 5/14-- -clear liquid diet -Continue IV fluids -Continue PPI -Blood cultures negative so far -Follow abscess culture from 5/18 -Trend leukocytosis -Pain management with PRN morphine and oxycodone -Repeat CT abdomen and pelvis per IR  Mild hyponatremia: Resolved. -Continue normal saline with KCl.  Hypokalemia: Likely due to IV fluids.  Resolved -Replenish and recheck  History of depression/bipolar disorder: Stable.  Not on medication.  History of asthma: Stable -Breathing treatment as needed  Addendum About 5 mm right lower lobe lung nodules and 6 mL left lower lobe lung nodule: incidental finding on CT abdomen -Noncontrast chest CT in 3 to 6 months.   -If stable at that time, repeat CT in 18 to 24 months  Scheduled Meds: . metoCLOPramide (REGLAN) injection  10 mg Intravenous Q8H  . pantoprazole (PROTONIX) IV  40 mg Intravenous Q24H  . sodium chloride flush  5 mL Intracatheter Q8H   Continuous Infusions: . piperacillin-tazobactam (ZOSYN)  IV 3.375 g (08/02/18 1455)   PRN Meds:.acetaminophen **OR** acetaminophen, albuterol, morphine injection, ondansetron (ZOFRAN) IV, simethicone   DVT prophylaxis: SCD Code Status: Full code Family Communication: Updated patient's wife over the phone on 5/18 Disposition Plan: Remains inpatient for complicated acute diverticulitis on IV antibiotics.   Consultants:   General surgery  IR  Procedures:   CT-guided drain placement on 5/18  Microbiology: . Blood cultures negative . COVID-19 negative .  Abscess culture on 5/18  Antimicrobials: Anti-infectives (From admission, onward)   Start     Dose/Rate Route Frequency Ordered Stop   07/30/18 1400   piperacillin-tazobactam (ZOSYN) IVPB 3.375 g     3.375 g 12.5 mL/hr over 240 Minutes Intravenous Every 8 hours 07/30/18 1322     07/30/18 1000  amoxicillin-clavulanate (AUGMENTIN) 875-125 MG per tablet 1 tablet  Status:  Discontinued     1 tablet Oral Every 12 hours 07/30/18 0820 07/30/18 1312   07/30/18 0000  amoxicillin-clavulanate (AUGMENTIN) 875-125 MG tablet     1 tablet Oral Every 12 hours 07/30/18 0913     07/29/18 2000  piperacillin-tazobactam (ZOSYN) IVPB 3.375 g  Status:  Discontinued     3.375 g 12.5 mL/hr over 240 Minutes Intravenous Every 8 hours 07/29/18 1328 07/30/18 0820   07/29/18 0700  ciprofloxacin (CIPRO) IVPB 400 mg  Status:  Discontinued     400 mg 200 mL/hr over 60 Minutes Intravenous Every 12 hours 07/28/18 2215 07/29/18 1327   07/29/18 0400  metroNIDAZOLE (FLAGYL) IVPB 500 mg  Status:  Discontinued     500 mg 100 mL/hr over 60 Minutes Intravenous Every 8 hours 07/28/18 2146 07/29/18 1328   07/28/18 1845  ciprofloxacin (CIPRO) IVPB 400 mg     400 mg 200 mL/hr over 60 Minutes Intravenous  Once 07/28/18 1835 07/28/18 2032   07/28/18 1845  metroNIDAZOLE (FLAGYL) IVPB 500 mg     500 mg 100 mL/hr over 60 Minutes Intravenous  Once 07/28/18 1835 07/28/18 2032      Objective: Vitals:   08/02/18 1415 08/02/18 1420 08/02/18 1425 08/02/18 1430  BP: 106/70 106/76 109/77 108/76  Pulse: 80 82 83 83  Resp: 19 14 14 15   Temp:      TempSrc:      SpO2: 100% 100% 99% 98%  Weight:      Height:        Intake/Output Summary (Last 24 hours) at 08/02/2018 1506 Last data filed at 08/02/2018 1449 Gross per 24 hour  Intake 2505 ml  Output 12 ml  Net 2493 ml   Filed Weights   07/31/18 0517 08/01/18 0600 08/02/18 0508  Weight: 75.8 kg 76.2 kg 75.7 kg    Examination:  GENERAL: No acute distress.  Appears well.  HEENT: MMM.  Vision and hearing grossly intact.  NECK: Supple.  No JVD.  LUNGS:  No IWOB. Good air movement bilaterally. HEART:  RRR. Heart sounds normal.   ABD: Bowel sounds present. Soft.  Mild tenderness to palpation and rebound over LLQ MSK/EXT:  Moves all extremities. No apparent deformity. No edema bilaterally.  SKIN: no apparent skin lesion or wound NEURO: Awake, alert and oriented appropriately.  No gross deficit.  PSYCH: Calm. Normal affect.  Data Reviewed: I have independently reviewed following labs and imaging studies  CBC: Recent Labs  Lab 07/28/18 1700 07/29/18 0559 07/30/18 0422 07/31/18 0446 08/02/18 0735  WBC 13.9* 15.5* 12.1* 10.1 14.2*  NEUTROABS 10.9*  --   --   --   --   HGB 14.2 13.9 12.7* 14.8 13.1  HCT 43.1 40.5 38.0* 42.7 38.7*  MCV 89.8 87.7 88.6 86.3 88.0  PLT 234 242 257 302 337   Basic Metabolic Panel: Recent Labs  Lab 07/29/18 0559 07/30/18 0422 07/31/18 0446 08/01/18 0625 08/02/18 0735  NA 137 138 134* 134* 137  K 3.2* 3.4* 3.4* 3.7 3.7  CL 102 107 99 99 104  CO2 22 23 21* 21* 23  GLUCOSE 108*  100* 127* 150* 108*  BUN 5* CREATININE 0.88 0.78 0.89 0.74 0.77  CALCIUM 8.6* 8.2* 8.8* 8.8* 8.5*  MG 1.8 2.1  --  2.0 2.0   GFR: Estimated Creatinine Clearance: 116.5 mL/min (by C-G formula based on SCr of 0.77 mg/dL). Liver Function Tests: Recent Labs  Lab 07/28/18 1700 08/01/18 0625  AST 21 16  ALT 20 19  ALKPHOS 60 53  BILITOT 0.5 0.8  PROT 7.2 7.0  ALBUMIN 3.9 2.6*   Recent Labs  Lab 07/28/18 1700  LIPASE 26   No results for input(s): AMMONIA in the last 168 hours. Coagulation Profile: Recent Labs  Lab 08/02/18 0735  INR 1.1   Cardiac Enzymes: No results for input(s): CKTOTAL, CKMB, CKMBINDEX, TROPONINI in the last 168 hours. BNP (last 3 results) No results for input(s): PROBNP in the last 8760 hours. HbA1C: No results for input(s): HGBA1C in the last 72 hours. CBG: No results for input(s): GLUCAP in the last 168 hours. Lipid Profile: No results for input(s): CHOL, HDL, LDLCALC, TRIG, CHOLHDL, LDLDIRECT in the last 72 hours. Thyroid Function Tests: No  results for input(s): TSH, T4TOTAL, FREET4, T3FREE, THYROIDAB in the last 72 hours. Anemia Panel: No results for input(s): VITAMINB12, FOLATE, FERRITIN, TIBC, IRON, RETICCTPCT in the last 72 hours. Urine analysis:    Component Value Date/Time   COLORURINE YELLOW 07/28/2018 1641   APPEARANCEUR CLEAR 07/28/2018 1641   LABSPEC 1.025 07/28/2018 1641   PHURINE 6.0 07/28/2018 1641   GLUCOSEU NEGATIVE 07/28/2018 1641   HGBUR TRACE (A) 07/28/2018 1641   BILIRUBINUR NEGATIVE 07/28/2018 1641   KETONESUR NEGATIVE 07/28/2018 1641   PROTEINUR NEGATIVE 07/28/2018 1641   NITRITE NEGATIVE 07/28/2018 1641   LEUKOCYTESUR NEGATIVE 07/28/2018 1641   Sepsis Labs: Invalid input(s): PROCALCITONIN, LACTICIDVEN  Recent Results (from the past 240 hour(s))  Blood culture (routine x 2)     Status: None   Collection Time: 07/28/18  7:00 PM  Result Value Ref Range Status   Specimen Description   Final    BLOOD RIGHT ANTECUBITAL Performed at [facility], [address] [place], [place] [address]    Special Requests   Final    BOTTLES DRAWN AEROBIC AND ANAEROBIC Blood Culture adequate volume Performed at [facility], [address] [place], [place] [address]    Culture   Final    NO GROWTH 5 DAYS Performed at [facility], [address] [place], [place] [address]    Report Status 08/02/2018 FINAL  Final  Blood culture (routine x 2)     Status: None   Collection Time: 07/28/18  7:05 PM  Result Value Ref Range Status   Specimen Description   Final    BLOOD RIGHT FOREARM Performed at [facility], [address] [place], [place] [address]    Special Requests   Final    BOTTLES DRAWN AEROBIC AND ANAEROBIC Blood Culture adequate volume Performed at [facility], [address] [place], [place] [address]    Culture   Final    NO GROWTH 5 DAYS Performed at [facility], [address], [place], [place] [address]    Report Status 08/02/2018  FINAL  Final  SARS Coronavirus 2 (Hosp order,Performed in [facility] lab via [employer] [facility])     Status: None   Collection Time: 07/28/18  7:10 PM  Result Value Ref Range Status   SARS Coronavirus 2 (  [employer] Now) NEGATIVE NEGATIVE Final    Comment: (NOTE) Interpretive Result Comment(s): COVID 19 Positive SARS CoV 2 target nucleic acids are DETECTED. The SARS CoV 2 RNA is generally detectable in upper and lower respiratory specimens during the acute phase of infection.  Positive results are indicative of active infection with SARS CoV 2.  Clinical correlation with patient history and other diagnostic information is necessary to determine patient infection status.  Positive results do not rule out bacterial infection or coinfection with other viruses. The expected result is Negative. COVID 19 Negative SARS CoV 2 target nucleic acids are NOT DETECTED. The SARS CoV 2 RNA is generally detectable in upper and lower respiratory specimens during the acute phase of infection.  Negative results do not preclude SARS CoV 2 infection, do not rule out coinfections with other pathogens, and should not be used as the sole basis for treatment or other patient management decisions.  Negative results must be combined with clinical  observations, patient history, and epidemiological information. The expected result is Negative. Invalid Presence or absence of SARS CoV 2 nucleic acids cannot be determined. Repeat testing was performed on the submitted specimen and repeated Invalid results were obtained.  If clinically indicated, additional testing on a new specimen with an alternate test methodology [phone]) is advised.  The SARS CoV 2 RNA is generally detectable in upper and lower respiratory specimens during the acute phase of infection. The expected result is Negative. Fact Sheet for Patients:  http://www.graves-ford.org/ Fact Sheet for Healthcare Providers:  [employer]si This test is not yet approved or cleared by the [place] FDA and has been authorized for detection and/or diagnosis of SARS CoV 2 by FDA under an Emergency Use Authorization (EUA).  This EUA will remain in effect (meaning this test can be used) for the duration of the COVID19 d eclaration under Section 564(b)(1) of the Act, 21 U.S.C. section [phone] 3(b)(1), unless the authorization is terminated or revoked sooner. Performed at [facility], [address], [place], [place] [address]       Radiology Studies: No results found.  Monique Hefty T. [facility] Pager [phone]  If 7PM-7AM, please contact night-coverage [email or web address] Password [facility] 08/02/2018, 3:06 PM

## 2018-08-03 DIAGNOSIS — K5720 Diverticulitis of large intestine with perforation and abscess without bleeding: Secondary | ICD-10-CM

## 2018-08-03 DIAGNOSIS — K651 Peritoneal abscess: Secondary | ICD-10-CM

## 2018-08-03 LAB — BASIC METABOLIC PANEL
Anion gap: 16 — ABNORMAL HIGH (ref 5–15)
BUN: 9 mg/dL (ref 6–20)
CO2: 22 mmol/L (ref 22–32)
Calcium: 8.7 mg/dL — ABNORMAL LOW (ref 8.9–10.3)
Chloride: 99 mmol/L (ref 98–111)
Creatinine, Ser: 0.81 mg/dL (ref 0.61–1.24)
GFR calc Af Amer: >60 mL/min (ref >60)
GFR calc non Af Amer: >60 mL/min (ref >60)
Glucose, Bld: 110 mg/dL — ABNORMAL HIGH (ref 70–99)
Potassium: 3.5 mmol/L (ref 3.5–5.1)
Sodium: 137 mmol/L (ref 135–145)

## 2018-08-03 LAB — CBC
HCT: 40.3 % (ref 39.0–52.0)
Hemoglobin: 13.7 g/dL (ref 13.0–17.0)
MCH: 29.3 pg (ref 26.0–34.0)
MCHC: 34 g/dL (ref 30.0–36.0)
MCV: 86.3 fL (ref 80.0–100.0)
Platelets: 405 10*3/uL — ABNORMAL HIGH (ref 150–400)
RBC: 4.67 MIL/uL (ref 4.22–5.81)
RDW: 12.2 % (ref 11.5–15.5)
WBC: 11.4 10*3/uL — ABNORMAL HIGH (ref 4.0–10.5)
nRBC: 0 % (ref 0.0–0.2)

## 2018-08-03 LAB — MAGNESIUM: Magnesium: 2.1 mg/dL (ref 1.7–2.4)

## 2018-08-03 NOTE — Progress Notes (Signed)
PROGRESS NOTE  Jay Davis ZOX:[id number] DOB: 05/25/1980 DOA: 07/28/2018 PCP: Patient, No Pcp Per   LOS: 6 days   Patient is from: Home  Brief Narrative / Interim history: 38 year old male with history of asthma, depression, bipolar disorder, former smoker, marijuana abuse presenting with abdominal pain and found to have acute diverticulitis without abscess or perforation.  In ED, febrile to 102.7 and tachycardic.  Otherwise, hemodynamically stable.  CBC with mild leukocytosis to 14.  Lactic acid, lipase, urinalysis and CMP not impressive.  COVID-19 negative.  CT abdomen and pelvis revealed acute diverticulitis of left colon with surrounding edema but no abscess or perforation.  Admitted for acute diverticulitis on IV Flagyl and Cipro.  Transitioned to IV Zosyn out of concern about his underlying psych issue with Cipro.  On 5/15, pain improved.  Diet advanced.  Later in the day, patient started having severe LLQ pain.  He was restarted on IV fluid, clear liquid diet and IV Zosyn.  KUB did not reveal significant finding other than gas throughout his bowel.  Patient continued to endorse significant abdominal pain.  On 5/17, he had frequent diarrhea and emesis.  Repeat CT abdomen and pelvis was obtained and showed perforated descending colon diverticulitis with 2 intra-abdominal abscesses and partial SBO.  General surgery was consulted and discussed the case with IR.  The consensus was to proceed with drain placement by IR.  Patient had CT-guided drain placement on 08/02/2018.   Subjective: Events overnight of this morning.  Feels much much better.  IR drained about 10 cc per chart.  Daily with some watery bowel movements.  No further emesis.  Assessment & Plan: Perforated descending diverticulitis with 2 intra-abdominal abscesses -CT abd/p on 5/13 with uncomplicated acute diverticulitis of left colon without perforation or abscesses -Repeat CT on 5/17 with perforated descending colon  diverticulitis, 2 intra-abdominal abscesses & partial SBO -CT-guided drain placement by IR on 5/18 -General surgery and IR following. -Cipro and Flagyl 5/13-5/14 -Zosyn 5/14-- -surgery advancing to full liquid diet. -Leukocytosis downtrending. -Discontinue IV fluids -Continue PPI -Blood cultures negative so far -Follow abscess culture from 5/18  Mild hyponatremia: Resolved. -Continue normal saline with KCl.  Hypokalemia: Likely due to IV fluids.  Resolved -Replenish and recheck  History of depression/bipolar disorder: Stable.  Not on medication.  History of asthma: Stable -Breathing treatment as needed  About 5 mm right lower lobe lung nodules and 6 mL left lower lobe lung nodule: incidental finding on CT abdomen -Noncontrast chest CT in 3 to 6 months.   -If stable at that time, repeat CT in 18 to 24 months  Scheduled Meds: . metoCLOPramide (REGLAN) injection  10 mg Intravenous Q8H  . pantoprazole (PROTONIX) IV  40 mg Intravenous Q24H  . sodium chloride flush  5 mL Intracatheter Q8H   Continuous Infusions: . sodium chloride Stopped (08/03/18 1038)  . piperacillin-tazobactam (ZOSYN)  IV 3.375 g (08/03/18 1434)   PRN Meds:.sodium chloride, acetaminophen **OR** acetaminophen, albuterol, morphine injection, ondansetron (ZOFRAN) IV, simethicone   DVT prophylaxis: SCD Code Status: Full code Family Communication: Updated patient's wife over the phone on 5/18 Disposition Plan: Remains inpatient for complicated acute diverticulitis on IV antibiotics.   Consultants:   General surgery  IR  Procedures:   CT-guided drain placement on 5/18  Microbiology: . Blood cultures negative . COVID-19 negative . Abscess culture on 5/18  Antimicrobials: Anti-infectives (From admission, onward)   Start     Dose/Rate Route Frequency Ordered Stop   07/30/18 1400  piperacillin-tazobactam (ZOSYN)  IVPB 3.375 g     3.375 g 12.5 mL/hr over 240 Minutes Intravenous Every 8 hours 07/30/18  1322     07/30/18 1000  amoxicillin-clavulanate (AUGMENTIN) 875-125 MG per tablet 1 tablet  Status:  Discontinued     1 tablet Oral Every 12 hours 07/30/18 0820 07/30/18 1312   07/30/18 0000  amoxicillin-clavulanate (AUGMENTIN) 875-125 MG tablet     1 tablet Oral Every 12 hours 07/30/18 0913     07/29/18 2000  piperacillin-tazobactam (ZOSYN) IVPB 3.375 g  Status:  Discontinued     3.375 g 12.5 mL/hr over 240 Minutes Intravenous Every 8 hours 07/29/18 1328 07/30/18 0820   07/29/18 0700  ciprofloxacin (CIPRO) IVPB 400 mg  Status:  Discontinued     400 mg 200 mL/hr over 60 Minutes Intravenous Every 12 hours 07/28/18 2215 07/29/18 1327   07/29/18 0400  metroNIDAZOLE (FLAGYL) IVPB 500 mg  Status:  Discontinued     500 mg 100 mL/hr over 60 Minutes Intravenous Every 8 hours 07/28/18 2146 07/29/18 1328   07/28/18 1845  ciprofloxacin (CIPRO) IVPB 400 mg     400 mg 200 mL/hr over 60 Minutes Intravenous  Once 07/28/18 1835 07/28/18 2032   07/28/18 1845  metroNIDAZOLE (FLAGYL) IVPB 500 mg     500 mg 100 mL/hr over 60 Minutes Intravenous  Once 07/28/18 1835 07/28/18 2032      Objective: Vitals:   08/02/18 1927 08/03/18 0546 08/03/18 0549 08/03/18 1223  BP: 133/83 118/90 132/77 (!) 130/91  Pulse: 87 89 72 85  Resp: 18 18 18 16   Temp: 97.9 F (36.6 C) 98.2 F (36.8 C) 98 F (36.7 C) 98.3 F (36.8 C)  TempSrc: Oral Oral Oral Oral  SpO2: 100% 99% 98% 100%  Weight:   74 kg   Height:        Intake/Output Summary (Last 24 hours) at 08/03/2018 1655 Last data filed at 08/03/2018 1434 Gross per 24 hour  Intake 1655.92 ml  Output 50 ml  Net 1605.92 ml   Filed Weights   08/01/18 0600 08/02/18 0508 08/03/18 0549  Weight: 76.2 kg 75.7 kg 74 kg    Examination:  GENERAL: No acute distress.  Appears well.  HEENT: MMM.  Vision and hearing grossly intact.  NECK: Supple.  No JVD.  LUNGS:  No IWOB. Good air movement bilaterally. HEART:  RRR. Heart sounds normal.  ABD: Bowel sounds present.  Soft.  Mild tenderness to palpation.  No rebound.  IR drain in place with a small degree fluid in the bulb MSK/EXT:  Moves all extremities. No apparent deformity. No edema bilaterally.  SKIN: no apparent skin lesion or wound NEURO: Awake, alert and oriented appropriately.  No gross deficit.  PSYCH: Calm. Normal affect.  Data Reviewed: I have independently reviewed following labs and imaging studies  CBC: Recent Labs  Lab 07/28/18 1700 07/29/18 0559 07/30/18 0422 07/31/18 0446 08/02/18 0735 08/03/18 0524  WBC 13.9* 15.5* 12.1* 10.1 14.2* 11.4*  NEUTROABS 10.9*  --   --   --   --   --   HGB 14.2 13.9 12.7* 14.8 13.1 13.7  HCT 43.1 40.5 38.0* 42.7 38.7* 40.3  MCV 89.8 87.7 88.6 86.3 88.0 86.3  PLT 234 242 257 302 337 405*   Basic Metabolic Panel: Recent Labs  Lab 07/29/18 0559 07/30/18 0422 07/31/18 0446 08/01/18 0625 08/02/18 0735 08/03/18 0524  NA 137 138 134* 134* 137 137  K 3.2* 3.4* 3.4* 3.7 3.7 3.5  CL 102 107 99 99  104 99  CO2 22 23 21* 21* 23 22  GLUCOSE 108* 100* 127* 150* 108* 110*  BUN 5* CREATININE 0.88 0.78 0.89 0.74 0.77 0.81  CALCIUM 8.6* 8.2* 8.8* 8.8* 8.5* 8.7*  MG 1.8 2.1  --  2.0 2.0 2.1   GFR: Estimated Creatinine Clearance: 113.9 mL/min (by C-G formula based on SCr of 0.81 mg/dL). Liver Function Tests: Recent Labs  Lab 07/28/18 1700 08/01/18 0625  AST 21 16  ALT 20 19  ALKPHOS 60 53  BILITOT 0.5 0.8  PROT 7.2 7.0  ALBUMIN 3.9 2.6*   Recent Labs  Lab 07/28/18 1700  LIPASE 26   No results for input(s): AMMONIA in the last 168 hours. Coagulation Profile: Recent Labs  Lab 08/02/18 0735  INR 1.1   Cardiac Enzymes: No results for input(s): CKTOTAL, CKMB, CKMBINDEX, TROPONINI in the last 168 hours. BNP (last 3 results) No results for input(s): PROBNP in the last 8760 hours. HbA1C: No results for input(s): HGBA1C in the last 72 hours. CBG: No results for input(s): GLUCAP in the last 168 hours. Lipid Profile: No  results for input(s): CHOL, HDL, LDLCALC, TRIG, CHOLHDL, LDLDIRECT in the last 72 hours. Thyroid Function Tests: No results for input(s): TSH, T4TOTAL, FREET4, T3FREE, THYROIDAB in the last 72 hours. Anemia Panel: No results for input(s): VITAMINB12, FOLATE, FERRITIN, TIBC, IRON, RETICCTPCT in the last 72 hours. Urine analysis:    Component Value Date/Time   COLORURINE YELLOW 07/28/2018 1641   APPEARANCEUR CLEAR 07/28/2018 1641   LABSPEC 1.025 07/28/2018 1641   PHURINE 6.0 07/28/2018 1641   GLUCOSEU NEGATIVE 07/28/2018 1641   HGBUR TRACE (A) 07/28/2018 1641   BILIRUBINUR NEGATIVE 07/28/2018 1641   KETONESUR NEGATIVE 07/28/2018 1641   PROTEINUR NEGATIVE 07/28/2018 1641   NITRITE NEGATIVE 07/28/2018 1641   LEUKOCYTESUR NEGATIVE 07/28/2018 1641   Sepsis Labs: Invalid input(s): PROCALCITONIN, LACTICIDVEN  Recent Results (from the past 240 hour(s))  Blood culture (routine x 2)     Status: None   Collection Time: 07/28/18  7:00 PM  Result Value Ref Range Status   Specimen Description   Final    BLOOD RIGHT ANTECUBITAL Performed at [facility], [address] [place], [place] [address]    Special Requests   Final    BOTTLES DRAWN AEROBIC AND ANAEROBIC Blood Culture adequate volume Performed at [facility], [address] [place], [place] [address]    Culture   Final    NO GROWTH 5 DAYS Performed at [facility], [address], [place], [place] [address]    Report Status 08/02/2018 FINAL  Final  Blood culture (routine x 2)     Status: None   Collection Time: 07/28/18  7:05 PM  Result Value Ref Range Status   Specimen Description   Final    BLOOD RIGHT FOREARM Performed at [facility], [address], [place], [place] [address]    Special Requests   Final    BOTTLES DRAWN AEROBIC AND ANAEROBIC Blood Culture adequate volume Performed at [facility], [address] [place], [place] [address]    Culture   Final    NO GROWTH  5 DAYS Performed at [facility], [address], [place], [place] [address]    Report Status 08/02/2018 FINAL  Final  SARS Coronavirus 2 (Hosp order,Performed in [facility] lab via [employer] [facility])     Status: None  Collection Time: 07/28/18  7:10 PM  Result Value Ref Range Status   SARS Coronavirus 2 ([employer] Now) NEGATIVE NEGATIVE Final    Comment: (NOTE) Interpretive Result Comment(s): COVID 19 Positive SARS CoV 2 target nucleic acids are DETECTED. The SARS CoV 2 RNA is generally detectable in upper and lower respiratory specimens during the acute phase of infection.  Positive results are indicative of active infection with SARS CoV 2.  Clinical correlation with patient history and other diagnostic information is necessary to determine patient infection status.  Positive results do not rule out bacterial infection or coinfection with other viruses. The expected result is Negative. COVID 19 Negative SARS CoV 2 target nucleic acids are NOT DETECTED. The SARS CoV 2 RNA is generally detectable in upper and lower respiratory specimens during the acute phase of infection.  Negative results do not preclude SARS CoV 2 infection, do not rule out coinfections with other pathogens, and should not be used as the sole basis for treatment or other patient management decisions.  Negative results must be combined with clinical  observations, patient history, and epidemiological information. The expected result is Negative. Invalid Presence or absence of SARS CoV 2 nucleic acids cannot be determined. Repeat testing was performed on the submitted specimen and repeated Invalid results were obtained.  If clinically indicated, additional testing on a new specimen with an alternate test methodology [phone]) is advised.  The SARS CoV 2 RNA is generally detectable in upper and lower respiratory specimens during the acute phase of infection. The expected result is Negative. Fact Sheet for  Patients:  http://www.graves-ford.org/ Fact Sheet for Healthcare Providers: EnviroConcern.si This test is not yet approved or cleared by the [place] FDA and has been authorized for detection and/or diagnosis of SARS CoV 2 by FDA under an Emergency Use Authorization (EUA).  This EUA will remain in effect (meaning this test can be used) for the duration of the COVID19 d eclaration under Section 564(b)(1) of the Act, 21 U.S.C. section [phone] 3(b)(1), unless the authorization is terminated or revoked sooner. Performed at [facility], [address] [place], [place] [address]   Aerobic/Anaerobic Culture (surgical/deep wound)     Status: None (Preliminary result)   Collection Time: 08/02/18  2:43 PM  Result Value Ref Range Status   Specimen Description ABSCESS  Final   Special Requests DRAINAGE LOWER ABDOMEN  Final   Gram Stain   Final    ABUNDANT WBC PRESENT, PREDOMINANTLY PMN RARE GRAM VARIABLE ROD    Culture   Final    NO GROWTH < 24 HOURS Performed at [facility], [address], [place], [place] [address]    Report Status PENDING  Incomplete      Radiology Studies: No results found.  Keyonna Comunale T. [facility] ([facility]) [facility] Pager [phone]  If 7PM-7AM, please contact night-coverage [email or web address] Password [email or web address] 08/03/2018, 4:55 PM

## 2018-08-03 NOTE — Progress Notes (Signed)
[facility] Progress Note     Subjective: CC: diverticulitis Patient tells me he is feeling much better since drain placement. He is tolerating CLD and had a BM, non-bloody. He is very hopeful that this resolves without surgery this admission.   Objective: Vital signs in last 24 hours: Temp:  [97.9 F (36.6 C)-98.2 F (36.8 C)] 98 F (36.7 C) (05/19 0549) Pulse Rate:  [72-89] 72 (05/19 0549) Resp:  [11-19] 18 (05/19 0549) BP: (106-133)/(67-90) 132/77 (05/19 0549) SpO2:  [98 %-100 %] 98 % (05/19 0549) Weight:  [74 kg] 74 kg (05/19 0549) Last BM Date: 08/02/18  Intake/Output from previous day: 05/18 0701 - 05/19 0700 In: 1483.9 [P.O.:840; I.V.:483.2; IV Piggyback:150.7] Out: 50 [Drains:50] Intake/Output this shift: Total I/O In: 480 [P.O.:480] Out: -   PE: Gen:  Alert, NAD, pleasant Card:  Regular rate and rhythm Pulm:  Normal effort, clear to auscultation bilaterally Abd: Soft, TTP in LLQ and suprapubic regions, mildly distended, +BS, drain in LLQ with thin yellow fluid Skin: warm and dry, no rashes  Psych: A&Ox3   Lab Results:  Recent Labs    08/02/18 0735 08/03/18 0524  WBC 14.2* 11.4*  HGB 13.1 13.7  HCT 38.7* 40.3  PLT 337 405*   BMET Recent Labs    08/02/18 0735 08/03/18 0524  NA 137 137  K 3.7 3.5  CL 104 99  CO2 23 22  GLUCOSE 108* 110*  BUN 11 9  CREATININE 0.77 0.81  CALCIUM 8.5* 8.7*   PT/INR Recent Labs    08/02/18 0735  LABPROT 14.5  INR 1.1   CMP     Component Value Date/Time   NA 137 08/03/2018 0524   K 3.5 08/03/2018 0524   CL 99 08/03/2018 0524   CO2 22 08/03/2018 0524   GLUCOSE 110 (H) 08/03/2018 0524   BUN 9 08/03/2018 0524   CREATININE 0.81 08/03/2018 0524   CALCIUM 8.7 (L) 08/03/2018 0524   PROT 7.0 08/01/2018 0625   ALBUMIN 2.6 (L) 08/01/2018 0625   AST 16 08/01/2018 0625   ALT 19 08/01/2018 0625   ALKPHOS 53 08/01/2018 0625   BILITOT 0.8 08/01/2018 0625   GFRNONAA >60 08/03/2018 0524   GFRAA >60  08/03/2018 0524   Lipase     Component Value Date/Time   LIPASE 26 07/28/2018 1700       Studies/Results: Ct Abdomen Pelvis W Contrast  Result Date: 08/01/2018 CLINICAL DATA:  Follow-up diverticulitis. Persistent generalized abdominal pain, nausea and vomiting and fevers. EXAM: CT ABDOMEN AND PELVIS WITH CONTRAST TECHNIQUE: Multidetector CT imaging of the abdomen and pelvis was performed using the standard protocol following bolus administration of intravenous contrast. CONTRAST:  100mL OMNIPAQUE IOHEXOL 300 MG/ML IV. COMPARISON:  07/28/2018. FINDINGS: Lower chest: Minimal linear atelectasis in the RIGHT LOWER LOBE. Vague approximate 5 mm nodule in the posterolateral RIGHT LOWER LOBE (series 4, image 6). Pleural based approximate 7 x 5 mm (6 mm mean) nodule involving the posterolateral LEFT LOWER LOBE. Normal heart size. Hepatobiliary: Liver normal in size and appearance. Gallbladder normal in appearance without calcified gallstones. No biliary ductal dilation. Pancreas: Normal in appearance without evidence of mass, ductal dilation, or inflammation. Spleen: Normal in size and appearance. Adrenals/Urinary Tract: Normal appearing adrenal glands. Contrast material in the renal collecting system obscures the previously identified RIGHT LOWER pole renal calculi. No focal parenchymal abnormality involving either kidney. No ureteral calculi. Urinary bladder decompressed and unremarkable. Stomach/Bowel: Stomach normal in appearance for the degree of distention. Interval development  of marked dilation of multiple loops of small bowel throughout the abdomen and UPPER pelvis with a transition point in the LOWER pelvis, likely at the level of the proximal ileum. The proximal ileal loops are thick-walled with mucosal enhancement, and there is extensive inflammation in the adjacent fat, indicating secondary inflammation from the perforated diverticulitis detailed below. Since the examination 4 days ago, the patient  has developed free intraperitoneal air and a small abscess adjacent to the descending colon at the site of diverticulitis, the abscess measuring approximately 4.4 x 2.6 x 3.5 cm. A second abscess is present in the LEFT UPPER pelvis anteriorly adjacent to an inflamed small bowel loop, measuring approximately 3.0 x 4.5 x 3.4 cm. Phlegmonous changes with extraluminal gas are present in the UPPER and mid LEFT pelvis anteriorly adjacent to the descending colon and proximal sigmoid colon. Vascular/Lymphatic: No visible aortoiliofemoral atherosclerosis. Widely patent visceral arteries. Normal-appearing portal venous and systemic venous systems. No pathologic lymphadenopathy. Reproductive: Prostate gland and seminal vesicles normal in size and appearance for age. Other: None. 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 No acute findings. IMPRESSION: 1. Perforated descending diverticulitis with pneumoperitoneum and at least 2 abscesses in the LEFT side of the pelvis, measured above. 2. Secondary inflammation of the small bowel which is likely the proximal ileum, accounting for a developing partial small bowel obstruction. 3. Nodules involving both lower lobes, measuring approximately 5 mm on the RIGHT and approximately 6 mm on the LEFT. Non-contrast chest CT at 3-6 months is recommended. If the nodules are stable at time of repeat CT, then future CT at 18-24 months (from today's scan) is considered optional for low-risk patients, but is recommended for high-risk patients. This recommendation follows the consensus statement: Guidelines for Management of Incidental Pulmonary Nodules Detected on CT Images: From the Fleischner Society 2017; Radiology 2017; 284:228-243. I telephoned these critical/emergent results to Dr. Alanda Slim of the hospitalist service at the time of interpretation on 08/01/2018 at 3 o'clock p.m. Electronically Signed   By: Hulan Saas M.D.   On: 08/01/2018 15:02    Ct Image Guided Drainage By Percutaneous Catheter  Result Date: 08/02/2018 INDICATION: 38 year old with diverticulitis and free air. Patient has multiple small gas and air-fluid collections. EXAM: CT GUIDED DRAINAGE OF INTRA-ABDOMINAL ABSCESS MEDICATIONS: The patient is currently admitted to the hospital and receiving intravenous antibiotics. ANESTHESIA/SEDATION: 2.0 mg IV Versed 100 mcg IV Fentanyl Moderate Sedation Time:  27 minutes The patient was continuously monitored during the procedure by the interventional radiology nurse under my direct supervision. COMPLICATIONS: None immediate. TECHNIQUE: Informed written consent was obtained from the patient after a thorough discussion of the procedural risks, benefits and alternatives. All questions were addressed. Maximal Sterile Barrier Technique was utilized including caps, mask, sterile gowns, sterile gloves, sterile drape, hand hygiene and skin antiseptic. A timeout was performed prior to the initiation of the procedure. PROCEDURE: Patient was placed supine on CT scanner and images through the lower abdomen were obtained. Largest air-fluid collection in the anterior lower abdomen was targeted for drainage. The left lower abdomen was prepped and draped in a sterile fashion. Skin was anesthetized with 1% lidocaine. Using CT guidance, 18 gauge trocar needle was directed into the air-fluid collection. Initially, only 1 mL of yellow serous looking fluid could be aspirated. Amplatz wire was advanced into the poorly defined collection. Tract was dilated to accommodate a 10.2 [place] drain. The drain was slightly pulled back and repositioned using CT guidance. Approximately 10 mL of thick yellow purulent  fluid was eventually removed. Small amount of gas was also aspirated. The catheter was sutured to skin and attached to a suction bulb. FINDINGS: Topogram again demonstrates dilated loops of small bowel in the mid abdomen. CT images demonstrate extensive inflammatory  changes in the lower abdomen. There are multiple small gas collections in the lower abdomen including one small collection anterior to the distal descending colon. The largest collection was felt to be at the lower abdominal midline. Needle and drain were placed within this irregular midline collection. Approximately 10 mL yellow purulent fluid was removed. No significant fluid associated with the other gas collections. In particular, the collection just anterior to the descending colon probably represents gas and phlegmon. IMPRESSION: CT-guided placement of a drainage catheter within a lower abdominal abscess. Multiple small gas pockets throughout the lower abdomen and near the descending colon. No additional drains were placed at this time due to the small size of these collections. However, patient will need follow-up CT imaging to see if these additional gas collections organize into discrete abscess collections. Electronically Signed   By: Richarda Overlie M.D.   On: 08/02/2018 16:54    Anti-infectives: Anti-infectives (From admission, onward)   Start     Dose/Rate Route Frequency Ordered Stop   07/30/18 1400  piperacillin-tazobactam (ZOSYN) IVPB 3.375 g     3.375 g 12.5 mL/hr over 240 Minutes Intravenous Every 8 hours 07/30/18 1322     07/30/18 1000  amoxicillin-clavulanate (AUGMENTIN) 875-125 MG per tablet 1 tablet  Status:  Discontinued     1 tablet Oral Every 12 hours 07/30/18 0820 07/30/18 1312   07/30/18 0000  amoxicillin-clavulanate (AUGMENTIN) 875-125 MG tablet     1 tablet Oral Every 12 hours 07/30/18 0913     07/29/18 2000  piperacillin-tazobactam (ZOSYN) IVPB 3.375 g  Status:  Discontinued     3.375 g 12.5 mL/hr over 240 Minutes Intravenous Every 8 hours 07/29/18 1328 07/30/18 0820   07/29/18 0700  ciprofloxacin (CIPRO) IVPB 400 mg  Status:  Discontinued     400 mg 200 mL/hr over 60 Minutes Intravenous Every 12 hours 07/28/18 2215 07/29/18 1327   07/29/18 0400  metroNIDAZOLE (FLAGYL)  IVPB 500 mg  Status:  Discontinued     500 mg 100 mL/hr over 60 Minutes Intravenous Every 8 hours 07/28/18 2146 07/29/18 1328   07/28/18 1845  ciprofloxacin (CIPRO) IVPB 400 mg     400 mg 200 mL/hr over 60 Minutes Intravenous  Once 07/28/18 1835 07/28/18 2032   07/28/18 1845  metroNIDAZOLE (FLAGYL) IVPB 500 mg     500 mg 100 mL/hr over 60 Minutes Intravenous  Once 07/28/18 1835 07/28/18 2032       Assessment/Plan Bipolar disorder - per medicine  Perforated descending diverticulitis with 2 intra-abdominal abscesses -s/p IR drain placement yesterday, cxs pending  -if he fails conservative management then he would need a colectomy/colostomy -cont zosyn -tolerating CLD, can try advancing to FLD today -WBC 11 from 14, afebrile  -cont to closely follow the patient  FEN - FLD VTE - may have chemical prophylaxis from our standpoint ID - zosyn 5/15 >> POC - Raynelle Highland, wife, [phone] ( will try and contact a little later today)  LOS: 6 days    Wells Guiles , [facility] Surgery 08/03/2018, 10:07 AM Pager: [phone] Consults: [phone]

## 2018-08-03 NOTE — Progress Notes (Signed)
IR rounding note via telephone per new regulations. Spoke with Marylene Land, RN.  Patient with history of diverticulitis with free air on CT s/p intraabdominal drain placement 08/02/2018 by Dr. Lowella Dandy.  RN states drain site c/d/i with approximately 20 cc milky yellow fluid in JP, states drain flushes/aspirates without resistance.  Continue current drain management- continue with Qshift flushes/monitor of output. Appreciate and agree with TRH and CCS management. IR to follow.   Waylan Boga Kissy Cielo, PA-C 08/03/2018, 11:17 AM

## 2018-08-04 LAB — BASIC METABOLIC PANEL WITH GFR
Anion gap: 12 (ref 5–15)
BUN: 9 mg/dL (ref 6–20)
CO2: 23 mmol/L (ref 22–32)
Calcium: 8.7 mg/dL — ABNORMAL LOW (ref 8.9–10.3)
Chloride: 103 mmol/L (ref 98–111)
Creatinine, Ser: 0.78 mg/dL (ref 0.61–1.24)
GFR calc Af Amer: >60 mL/min
GFR calc non Af Amer: >60 mL/min
Glucose, Bld: 110 mg/dL — ABNORMAL HIGH (ref 70–99)
Potassium: 3.4 mmol/L — ABNORMAL LOW (ref 3.5–5.1)
Sodium: 138 mmol/L (ref 135–145)

## 2018-08-04 LAB — CBC
HCT: 38.4 % — ABNORMAL LOW (ref 39.0–52.0)
Hemoglobin: 13.1 g/dL (ref 13.0–17.0)
MCH: 29.6 pg (ref 26.0–34.0)
MCHC: 34.1 g/dL (ref 30.0–36.0)
MCV: 86.7 fL (ref 80.0–100.0)
Platelets: 436 K/uL — ABNORMAL HIGH (ref 150–400)
RBC: 4.43 MIL/uL (ref 4.22–5.81)
RDW: 12.2 % (ref 11.5–15.5)
WBC: 10.5 K/uL (ref 4.0–10.5)
nRBC: 0 % (ref 0.0–0.2)

## 2018-08-04 LAB — MAGNESIUM: Magnesium: 2.2 mg/dL (ref 1.7–2.4)

## 2018-08-04 MED ORDER — POTASSIUM CHLORIDE 20 MEQ PO PACK
20.0000 meq | PACK | Freq: Once | ORAL | Status: AC
  Administered 2018-08-04: 20 meq via ORAL
  Filled 2018-08-04: qty 1

## 2018-08-04 MED ORDER — POTASSIUM CHLORIDE 20 MEQ PO PACK: 20.0000 meq | PACK | Freq: Once | ORAL | Status: DC

## 2018-08-04 NOTE — Progress Notes (Signed)
Patient ID: Jay Davis, male   DOB: 12/23/1980, 38 y.o.   MRN: [id number]       Subjective: Patient states he is feeling better today.  On clear liquids.  Objective: Vital signs in last 24 hours: Temp:  [98.2 F (36.8 C)-98.5 F (36.9 C)] 98.2 F (36.8 C) (05/20 0410) Pulse Rate:  [79-85] 79 (05/20 0410) Resp:  [16-20] 18 (05/20 0410) BP: (115-131)/(82-91) 131/84 (05/20 0410) SpO2:  [100 %] 100 % (05/20 0410) Weight:  [73.6 kg] 73.6 kg (05/20 0410) Last BM Date: 08/03/18  Intake/Output from previous day: 05/19 0701 - 05/20 0700 In: 1798.4 [P.O.:1560; I.V.:50.1; IV Piggyback:173.2] Out: 155 [Urine:100; Drains:55] Intake/Output this shift: Total I/O In: 240 [P.O.:240] Out: 0   PE: Abd: soft, less tender, but still somewhat in LLQ, but no guarding today.  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  Lab Results:  Recent Labs    08/03/18 0524 08/04/18 0458  WBC 11.4* 10.5  HGB 13.7 13.1  HCT 40.3 38.4*  PLT 405* 436*   BMET Recent Labs    08/03/18 0524 08/04/18 0458  NA 137 138  K 3.5 3.4*  CL 99 103  CO2 22 23  GLUCOSE 110* 110*  BUN 9 9  CREATININE 0.81 0.78  CALCIUM 8.7* 8.7*   PT/INR Recent Labs    08/02/18 0735  LABPROT 14.5  INR 1.1   CMP     Component Value Date/Time   NA 138 08/04/2018 0458   K 3.4 (L) 08/04/2018 0458   CL 103 08/04/2018 0458   CO2 23 08/04/2018 0458   GLUCOSE 110 (H) 08/04/2018 0458   BUN 9 08/04/2018 0458   CREATININE 0.78 08/04/2018 0458   CALCIUM 8.7 (L) 08/04/2018 0458   PROT 7.0 08/01/2018 0625   ALBUMIN 2.6 (L) 08/01/2018 0625   AST 16 08/01/2018 0625   ALT 19 08/01/2018 0625   ALKPHOS 53 08/01/2018 0625   BILITOT 0.8 08/01/2018 0625   GFRNONAA >60 08/04/2018 0458   GFRAA >60 08/04/2018 0458   Lipase     Component Value Date/Time   LIPASE 26 07/28/2018 1700       Studies/Results: Ct Image Guided Drainage By Percutaneous  Catheter  Result Date: 08/02/2018 INDICATION: 38 year old with diverticulitis and free air. Patient has multiple small gas and air-fluid collections. EXAM: CT GUIDED DRAINAGE OF INTRA-ABDOMINAL ABSCESS MEDICATIONS: The patient is currently admitted to the hospital and receiving intravenous antibiotics. ANESTHESIA/SEDATION: 2.0 mg IV Versed 100 mcg IV Fentanyl Moderate Sedation Time:  27 minutes The patient was continuously monitored during the procedure by the interventional radiology nurse under my direct supervision. COMPLICATIONS: None immediate. TECHNIQUE: Informed written consent was obtained from the patient after a thorough discussion of the procedural risks, benefits and alternatives. All questions were addressed. Maximal Sterile Barrier Technique was utilized including caps, mask, sterile gowns, sterile gloves, sterile drape, hand hygiene and skin antiseptic. A timeout was performed prior to the initiation of the procedure. PROCEDURE: Patient was placed supine on CT scanner and images through the lower abdomen were obtained. Largest air-fluid collection in the anterior lower abdomen was targeted for drainage. The left lower abdomen was prepped and draped in a sterile fashion. Skin was anesthetized with 1% lidocaine. Using CT guidance, 18 gauge trocar needle was directed into the air-fluid collection. Initially, only 1 mL of yellow serous looking fluid could be aspirated. Amplatz wire was advanced into the poorly defined collection. Tract was  dilated to accommodate a 10.2 [place] drain. The drain was slightly pulled back and repositioned using CT guidance. Approximately 10 mL of thick yellow purulent fluid was eventually removed. Small amount of gas was also aspirated. The catheter was sutured to skin and attached to a suction bulb. FINDINGS: Topogram again demonstrates dilated loops of small bowel in the mid abdomen. CT images demonstrate extensive inflammatory changes in the lower abdomen. There are  multiple small gas collections in the lower abdomen including one small collection anterior to the distal descending colon. The largest collection was felt to be at the lower abdominal midline. Needle and drain were placed within this irregular midline collection. Approximately 10 mL yellow purulent fluid was removed. No significant fluid associated with the other gas collections. In particular, the collection just anterior to the descending colon probably represents gas and phlegmon. IMPRESSION: CT-guided placement of a drainage catheter within a lower abdominal abscess. Multiple small gas pockets throughout the lower abdomen and near the descending colon. No additional drains were placed at this time due to the small size of these collections. However, patient will need follow-up CT imaging to see if these additional gas collections organize into discrete abscess collections. Electronically Signed   By: Richarda Overlie M.D.   On: 08/02/2018 16:54    Anti-infectives: Anti-infectives (From admission, onward)   Start     Dose/Rate Route Frequency Ordered Stop   07/30/18 1400  piperacillin-tazobactam (ZOSYN) IVPB 3.375 g     3.375 g 12.5 mL/hr over 240 Minutes Intravenous Every 8 hours 07/30/18 1322     07/30/18 1000  amoxicillin-clavulanate (AUGMENTIN) 875-125 MG per tablet 1 tablet  Status:  Discontinued     1 tablet Oral Every 12 hours 07/30/18 0820 07/30/18 1312   07/30/18 0000  amoxicillin-clavulanate (AUGMENTIN) 875-125 MG tablet     1 tablet Oral Every 12 hours 07/30/18 0913     07/29/18 2000  piperacillin-tazobactam (ZOSYN) IVPB 3.375 g  Status:  Discontinued     3.375 g 12.5 mL/hr over 240 Minutes Intravenous Every 8 hours 07/29/18 1328 07/30/18 0820   07/29/18 0700  ciprofloxacin (CIPRO) IVPB 400 mg  Status:  Discontinued     400 mg 200 mL/hr over 60 Minutes Intravenous Every 12 hours 07/28/18 2215 07/29/18 1327   07/29/18 0400  metroNIDAZOLE (FLAGYL) IVPB 500 mg  Status:  Discontinued     500  mg 100 mL/hr over 60 Minutes Intravenous Every 8 hours 07/28/18 2146 07/29/18 1328   07/28/18 1845  ciprofloxacin (CIPRO) IVPB 400 mg     400 mg 200 mL/hr over 60 Minutes Intravenous  Once 07/28/18 1835 07/28/18 2032   07/28/18 1845  metroNIDAZOLE (FLAGYL) IVPB 500 mg     500 mg 100 mL/hr over 60 Minutes Intravenous  Once 07/28/18 1835 07/28/18 2032       Assessment/Plan Bipolar disorder- per medicine  Perforated descending diverticulitis with 2 intra-abdominal abscesses -s/p IR drain placement yesterday, cxs pending, gram variable rods currently  -if he fails conservative management then he would need a colectomy/colostomy -cont zosyn -leave on fulls given amount of inflammatory changes to colon and probably needs repeat CT tomorrow to evaluate progression since drain placement -WBC normalized, will follow -cont to closely follow the patient  FEN -FLD VTE -may have chemical prophylaxis from our standpoint ID -zosyn 5/15 >> POC - Raynelle Highland, wife, [phone]    LOS: 7 days    Letha Cape , [facility] 08/04/2018, 9:46 AM Pager: [phone]

## 2018-08-04 NOTE — TOC Progression Note (Signed)
Transition of Care [facility]) - Progression Note    Patient Details  Name: Jay Davis MRN: [id number] Date of Birth: [birth year]  Transition of Care [facility]) CM/SW Contact  Reola Mosher Phone Number: [phone] 08/04/2018, 2:50 PM  Clinical Narrative:    08/04/2018 - CM following for progression of care.  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 program for medication assistance; discharge prescriptions to be filled by [facility] pharmacy at discharge and delivered to the room prior to dc home. Abelino Derrick RN,MHA,BSN   Expected Discharge Plan: Home/Self Care Barriers to Discharge: No Barriers Identified  Expected Discharge Plan and Services Expected Discharge Plan: Home/Self Care In-house Referral: Financial Counselor Discharge Planning Services: CM Consult, Follow-up appt scheduled, Medication Assistance   Living arrangements for the past 2 months: Single Family Home Expected Discharge Date: 07/30/18               DME Arranged: N/A DME Agency: NA       HH Arranged: NA HH Agency: NA         Social Determinants of Health (SDOH) Interventions    Readmission Risk Interventions No flowsheet data found.

## 2018-08-04 NOTE — Progress Notes (Signed)
Updated wife, Marchelle Folks, on status of patient's surgical condition today.  She is very appreciative of his care.  Letha Cape 10:57 AM 08/04/2018

## 2018-08-04 NOTE — Progress Notes (Signed)
PROGRESS NOTE    Jay Davis  ZOX:[id number] DOB: Apr 20, 1980 DOA: 07/28/2018 PCP: Patient, No Pcp Per   Brief Narrative:  38 year old male with history of asthma, depression, bipolar disorder, former smoker, marijuana abuse presenting with abdominal pain and found to have acute diverticulitis without abscess or perforation.  In ED, febrile to 102.7 and tachycardic.  Otherwise, hemodynamically stable.  CBC with mild leukocytosis to 14.  Lactic acid, lipase, urinalysis and CMP not impressive.  COVID-19 negative.  CT abdomen and pelvis revealed acute diverticulitis of left colon with surrounding edema but no abscess or perforation.  Admitted for acute diverticulitis on IV Flagyl and Cipro.  Transitioned to IV Zosyn out of concern about his underlying psych issue with Cipro.   On 5/15, pain improved.  Diet advanced.  Later in the day, patient started having severe LLQ pain.  He was restarted on IV fluid, clear liquid diet and IV Zosyn.  KUB did not reveal significant finding other than gas throughout his bowel.  Patient continued to endorse significant abdominal pain.  On 5/17, he had frequent diarrhea and emesis.  Repeat CT abdomen and pelvis was obtained and showed perforated descending colon diverticulitis with 2 intra-abdominal abscesses and partial SBO.  General surgery was consulted and discussed the case with IR.  The consensus was to proceed with drain placement by IR.  Patient had CT-guided drain placement on 08/02/2018.   Assessment & Plan:   Principal Problem:   Acute diverticulitis Active Problems:   Sepsis (HCC)   Asthma   Depression   Perforated descending diverticulitis with 2 intra-abdominal abscesses -CT abd/p on 5/13 with uncomplicated acute diverticulitis of left colon without perforation or abscesses -Repeat CT on 5/17 with perforated descending colon diverticulitis, 2 intra-abdominal abscesses & partial SBO -CT-guided drain placement by IR on 5/18 -General surgery and  IR following. -Cipro and Flagyl 5/13-5/14 - Zosyn 5/14-- - Plan for repeat CT scan on 5/21 AM - surgery advancing to full liquid diet. - Leukocytosis improved. - Discontinue IV fluids - Continue PPI and reglan for now - Blood cultures NGTD x 5 days - Follow abscess culture from 5/18 - gram variable rod, continue to follow   Mild hyponatremia: Resolved.  Hypokalemia: replace and follow  History of depression/bipolar disorder: Stable.  Not on medication.  History of asthma: Stable -Breathing treatment as needed  About 5 mm right lower lobe lung nodules and 6 mL left lower lobe lung nodule: incidental finding on CT abdomen -Noncontrast chest CT in 3 to 6 months.   -If stable at that time, repeat CT in 18 to 24 months  DVT prophylaxis: SCd Code Status: full  Family Communication: pending Disposition Plan: pending further eval by surgery.  Repeat imaging pending.     Consultants:   GI  IR  Procedures:  5/18 CT guided placement of drainage catheter within lower abdominal abscess  Antimicrobials:  Anti-infectives (From admission, onward)   Start     Dose/Rate Route Frequency Ordered Stop   07/30/18 1400  piperacillin-tazobactam (ZOSYN) IVPB 3.375 g     3.375 g 12.5 mL/hr over 240 Minutes Intravenous Every 8 hours 07/30/18 1322     07/30/18 1000  amoxicillin-clavulanate (AUGMENTIN) 875-125 MG per tablet 1 tablet  Status:  Discontinued     1 tablet Oral Every 12 hours 07/30/18 0820 07/30/18 1312   07/30/18 0000  amoxicillin-clavulanate (AUGMENTIN) 875-125 MG tablet     1 tablet Oral Every 12 hours 07/30/18 0913     07/29/18 2000  piperacillin-tazobactam (ZOSYN) IVPB 3.375 g  Status:  Discontinued     3.375 g 12.5 mL/hr over 240 Minutes Intravenous Every 8 hours 07/29/18 1328 07/30/18 0820   07/29/18 0700  ciprofloxacin (CIPRO) IVPB 400 mg  Status:  Discontinued     400 mg 200 mL/hr over 60 Minutes Intravenous Every 12 hours 07/28/18 2215 07/29/18 1327   07/29/18  0400  metroNIDAZOLE (FLAGYL) IVPB 500 mg  Status:  Discontinued     500 mg 100 mL/hr over 60 Minutes Intravenous Every 8 hours 07/28/18 2146 07/29/18 1328   07/28/18 1845  ciprofloxacin (CIPRO) IVPB 400 mg     400 mg 200 mL/hr over 60 Minutes Intravenous  Once 07/28/18 1835 07/28/18 2032   07/28/18 1845  metroNIDAZOLE (FLAGYL) IVPB 500 mg     500 mg 100 mL/hr over 60 Minutes Intravenous  Once 07/28/18 1835 07/28/18 2032         Subjective: Pain improved. Declined spanish interpreter.  Objective: Vitals:   08/03/18 0549 08/03/18 1223 08/03/18 2014 08/04/18 0410  BP: 132/77 (!) 130/91 115/82 131/84  Pulse: 72 85 83 79  Resp: Temp: 98 F (36.7 C) 98.3 F (36.8 C) 98.5 F (36.9 C) 98.2 F (36.8 C)  TempSrc: Oral Oral Oral Oral  SpO2: 98% 100% 100% 100%  Weight: 74 kg   73.6 kg  Height:        Intake/Output Summary (Last 24 hours) at 08/04/2018 1543 Last data filed at 08/04/2018 1323 Gross per 24 hour  Intake 1458.21 ml  Output 146 ml  Net 1312.21 ml   Filed Weights   08/02/18 0508 08/03/18 0549 08/04/18 0410  Weight: 75.7 kg 74 kg 73.6 kg    Examination:  General exam: Appears calm and comfortable  Respiratory system: Clear to auscultation. Respiratory effort normal. Cardiovascular system: S1 & S2 heard, RRR.  Gastrointestinal system: Abdomen is nondistended, soft and nontender.  Drain in place LLQ with yellowish output. Central nervous system: Alert and oriented. No focal neurological deficits. Extremities: no LEE Skin: No rashes, lesions or ulcers Psychiatry: Judgement and insight appear normal. Mood & affect appropriate.     Data Reviewed: I have personally reviewed following labs and imaging studies  CBC: Recent Labs  Lab 07/28/18 1700  07/30/18 0422 07/31/18 0446 08/02/18 0735 08/03/18 0524 08/04/18 0458  WBC 13.9*   < > 12.1* 10.1 14.2* 11.4* 10.5  NEUTROABS 10.9*  --   --   --   --   --   --   HGB 14.2   < > 12.7* 14.8 13.1 13.7  13.1  HCT 43.1   < > 38.0* 42.7 38.7* 40.3 38.4*  MCV 89.8   < > 88.6 86.3 88.0 86.3 86.7  PLT 234   < > 257 302 337 405* 436*   < > = values in this interval not displayed.   Basic Metabolic Panel: Recent Labs  Lab 07/30/18 0422 07/31/18 0446 08/01/18 0625 08/02/18 0735 08/03/18 0524 08/04/18 0458  NA 138 134* 134* 137 137 138  K 3.4* 3.4* 3.7 3.7 3.5 3.4*  CL 107 99 99 104 99 103  CO2 23 21* 21* GLUCOSE 100* 127* 150* 108* 110* 110*  BUN CREATININE 0.78 0.89 0.74 0.77 0.81 0.78  CALCIUM 8.2* 8.8* 8.8* 8.5* 8.7* 8.7*  MG 2.1  --  2.0 2.0 2.1 2.2   GFR: Estimated Creatinine Clearance: 115.1 mL/min (by C-G formula  based on SCr of 0.78 mg/dL). Liver Function Tests: Recent Labs  Lab 07/28/18 1700 08/01/18 0625  AST 21 16  ALT 20 19  ALKPHOS 60 53  BILITOT 0.5 0.8  PROT 7.2 7.0  ALBUMIN 3.9 2.6*   Recent Labs  Lab 07/28/18 1700  LIPASE 26   No results for input(s): AMMONIA in the last 168 hours. Coagulation Profile: Recent Labs  Lab 08/02/18 0735  INR 1.1   Cardiac Enzymes: No results for input(s): CKTOTAL, CKMB, CKMBINDEX, TROPONINI in the last 168 hours. BNP (last 3 results) No results for input(s): PROBNP in the last 8760 hours. HbA1C: No results for input(s): HGBA1C in the last 72 hours. CBG: No results for input(s): GLUCAP in the last 168 hours. Lipid Profile: No results for input(s): CHOL, HDL, LDLCALC, TRIG, CHOLHDL, LDLDIRECT in the last 72 hours. Thyroid Function Tests: No results for input(s): TSH, T4TOTAL, FREET4, T3FREE, THYROIDAB in the last 72 hours. Anemia Panel: No results for input(s): VITAMINB12, FOLATE, FERRITIN, TIBC, IRON, RETICCTPCT in the last 72 hours. Sepsis Labs: Recent Labs  Lab 07/28/18 1856 07/28/18 2259  PROCALCITON  --  <0.10  LATICACIDVEN 1.4 1.4    Recent Results (from the past 240 hour(s))  Blood culture (routine x 2)     Status: None   Collection Time: 07/28/18  7:00 PM  Result Value  Ref Range Status   Specimen Description   Final    BLOOD RIGHT ANTECUBITAL Performed at [facility], [address] [place], [place] [address]    Special Requests   Final    BOTTLES DRAWN AEROBIC AND ANAEROBIC Blood Culture adequate volume Performed at [facility], [address], [place], [place] [address]    Culture   Final    NO GROWTH 5 DAYS Performed at [facility], [address] [place], [place] [address]    Report Status 08/02/2018 FINAL  Final  Blood culture (routine x 2)     Status: None   Collection Time: 07/28/18  7:05 PM  Result Value Ref Range Status   Specimen Description   Final    BLOOD RIGHT FOREARM Performed at [facility], [address] [place], [place] [address]    Special Requests   Final    BOTTLES DRAWN AEROBIC AND ANAEROBIC Blood Culture adequate volume Performed at [facility], [address] [place], [place] [address]    Culture   Final    NO GROWTH 5 DAYS Performed at [facility], [address], [place], [place] [address]    Report Status 08/02/2018 FINAL  Final  SARS Coronavirus 2 (Hosp order,Performed in [facility] lab via [facility])     Status: None   Collection Time: 07/28/18  7:10 PM  Result Value Ref Range Status   SARS Coronavirus 2 ([employer] [facility] Now) NEGATIVE NEGATIVE Final    Comment: (NOTE) Interpretive Result Comment(s): COVID 19 Positive SARS CoV 2 target nucleic acids are DETECTED. The SARS CoV 2 RNA is generally detectable in upper and lower respiratory specimens during the acute phase of infection.  Positive results are indicative of active infection with SARS CoV 2.  Clinical correlation with patient history and other diagnostic information is necessary to determine patient infection status.  Positive results do not rule out bacterial infection or coinfection with other viruses. The expected result is Negative. COVID 19 Negative SARS CoV 2 target nucleic acids are NOT  DETECTED. The  SARS CoV 2 RNA is generally detectable in upper and lower respiratory specimens during the acute phase of infection.  Negative results do not preclude SARS CoV 2 infection, do not rule out coinfections with other pathogens, and should not be used as the sole basis for treatment or other patient management decisions.  Negative results must be combined with clinical  observations, patient history, and epidemiological information. The expected result is Negative. Invalid Presence or absence of SARS CoV 2 nucleic acids cannot be determined. Repeat testing was performed on the submitted specimen and repeated Invalid results were obtained.  If clinically indicated, additional testing on Hisham Provence new specimen with an alternate test methodology [phone]([id number]) is advised.  The SARS CoV 2 RNA is generally detectable in upper and lower respiratory specimens during the acute phase of infection. The expected result is Negative. Fact Sheet for Patients:  http://www.graves-ford.org/https://www.fda.gov/media/136524/download Fact Sheet for Healthcare Providers: EnviroConcern.sihttps://www.fda.gov/media/136523/download This test is not yet approved or cleared by the [place] FDA and has been authorized for detection and/or diagnosis of SARS CoV 2 by FDA under an Emergency Use Authorization (EUA).  This EUA will remain in effect (meaning this test can be used) for the duration of the COVID19 d eclaration under Section 564(b)(1) of the Act, 21 U.S.C. section [id number] 3(b)(1), unless the authorization is terminated or revoked sooner. Performed at [facility], [address] [place], [place] [address]   Aerobic/Anaerobic Culture (surgical/deep wound)     Status: None (Preliminary result)   Collection Time: 08/02/18  2:43 PM  Result Value Ref Range Status   Specimen Description ABSCESS  Final   Special Requests DRAINAGE LOWER ABDOMEN  Final   Gram Stain   Final    ABUNDANT WBC PRESENT, PREDOMINANTLY PMN RARE GRAM VARIABLE  ROD    Culture   Final    CULTURE REINCUBATED FOR BETTER GROWTH Performed at [facility], [address] [place], [place] [address]    Report Status PENDING  Incomplete         Radiology Studies: No results found.      Scheduled Meds: . metoCLOPramide (REGLAN) injection  10 mg Intravenous Q8H  . pantoprazole (PROTONIX) IV  40 mg Intravenous Q24H  . sodium chloride flush  5 mL Intracatheter Q8H   Continuous Infusions: . sodium chloride Stopped (08/03/18 1038)  . piperacillin-tazobactam (ZOSYN)  IV 3.375 g (08/04/18 1331)     LOS: 7 days    Time spent: over 30 min    Lacretia Nicksaldwell Powell, MD [facility] Pager AMION  If 7PM-7AM, please contact night-coverage [email or web address] Password [email or web address] 08/04/2018, 3:43 PM

## 2018-08-04 NOTE — Progress Notes (Signed)
IR rounding note via telephone per new regulations. Spoke with Marylene Land, RN.  Patient with history of diverticulitis with free air on CT s/p intraabdominal drain placement 08/02/2018 by Dr. Lowella Dandy.  RN states drain site c/d/i with approximately 10 cc (since 0700 this AM) of milky brown/green fluid with debris in JP, states drain flushes/aspirates without resistance.  Continue current drain management- continue with Qshift flushes/monitor of output. Appreciate and agree with TRH and CCS management. IR to follow.   Waylan Boga Lajean Boese, PA-C 08/04/2018, 9:52 AM

## 2018-08-05 ENCOUNTER — Inpatient Hospital Stay: Payer: Self-pay

## 2018-08-05 ENCOUNTER — Inpatient Hospital Stay (HOSPITAL_COMMUNITY): Payer: Self-pay

## 2018-08-05 LAB — BASIC METABOLIC PANEL
Anion gap: 9 (ref 5–15)
BUN: 7 mg/dL (ref 6–20)
CO2: 25 mmol/L (ref 22–32)
Calcium: 8.8 mg/dL — ABNORMAL LOW (ref 8.9–10.3)
Chloride: 103 mmol/L (ref 98–111)
Creatinine, Ser: 0.8 mg/dL (ref 0.61–1.24)
GFR calc Af Amer: >60 mL/min (ref >60)
GFR calc non Af Amer: >60 mL/min (ref >60)
Glucose, Bld: 104 mg/dL — ABNORMAL HIGH (ref 70–99)
Potassium: 3.7 mmol/L (ref 3.5–5.1)
Sodium: 137 mmol/L (ref 135–145)

## 2018-08-05 LAB — CBC
HCT: 38.2 % — ABNORMAL LOW (ref 39.0–52.0)
Hemoglobin: 12.8 g/dL — ABNORMAL LOW (ref 13.0–17.0)
MCH: 29.2 pg (ref 26.0–34.0)
MCHC: 33.5 g/dL (ref 30.0–36.0)
MCV: 87 fL (ref 80.0–100.0)
Platelets: 486 10*3/uL — ABNORMAL HIGH (ref 150–400)
RBC: 4.39 MIL/uL (ref 4.22–5.81)
RDW: 12.3 % (ref 11.5–15.5)
WBC: 12.2 10*3/uL — ABNORMAL HIGH (ref 4.0–10.5)
nRBC: 0 % (ref 0.0–0.2)

## 2018-08-05 LAB — MAGNESIUM: Magnesium: 2.2 mg/dL (ref 1.7–2.4)

## 2018-08-05 MED ORDER — ENSURE ENLIVE PO LIQD
237.0000 mL | Freq: Two times a day (BID) | ORAL | Status: DC
  Administered 2018-08-05 – 2018-08-07 (×2): 237 mL via ORAL

## 2018-08-05 MED ORDER — ADULT MULTIVITAMIN W/MINERALS CH
1.0000 | ORAL_TABLET | Freq: Every day | ORAL | Status: DC
  Administered 2018-08-05 – 2018-08-07 (×3): 1 via ORAL
  Filled 2018-08-05 (×3): qty 1

## 2018-08-05 MED ORDER — FLUCONAZOLE IN SODIUM CHLORIDE 400-0.9 MG/200ML-% IV SOLN
400.0000 mg | INTRAVENOUS | Status: DC
  Administered 2018-08-05 – 2018-08-06 (×2): 400 mg via INTRAVENOUS
  Filled 2018-08-05 (×2): qty 200

## 2018-08-05 MED ORDER — IOHEXOL 300 MG/ML  SOLN
100.0000 mL | Freq: Once | INTRAMUSCULAR | Status: AC | PRN
  Administered 2018-08-05: 12:00:00 100 mL via INTRAVENOUS

## 2018-08-05 NOTE — Progress Notes (Addendum)
Initial Nutrition Assessment  RD working remotely.  DOCUMENTATION CODES:   Not applicable  INTERVENTION:   -Ensure Enlive po BID, each supplement provides 350 kcal and 20 grams of protein -MVI with minerals daily  NUTRITION DIAGNOSIS:   Increased nutrient needs related to acute illness(diverticulitis) as evidenced by estimated needs.  GOAL:   Patient will meet greater than or equal to 90% of their needs  MONITOR:   PO intake, Supplement acceptance, Diet advancement, Vent status, Weight trends, Skin, I & O's  REASON FOR ASSESSMENT:   LOS    ASSESSMENT:   Jay Davis is a 38 y.o. male with medical history significant of asthma, depression, bipolar, former smoker, marijuana abuse, who presents with abdominal pain.  Pt admitted with sepsis due to acute diverticulitis.  5/18- irregular abscess/gas collections found in abdomen; drain placed in largest abscess pocket along anterior lower abdomen  Reviewed I/O's: +2.1 L x 24 hours and +10.6 L since admission  UOP: 6 ml x 24 hours  Drain output: 30 ml x 24 hours  Per general surgery notes, plan to repeat CT today. Pt would require colectomy/colostomy if he fails conservative management.   Pt on a full liquid diet and tolerating well; noted meal completion 75-100%.   Reviewed wt hx; pt has experienced a 8.6% wt loss over the past 10 months, which is not significant for time frame.   Labs reviewed.   Diet Order:   Diet Order            Diet full liquid Room service appropriate? Yes; Fluid consistency: Thin  Diet effective now        Diet general              EDUCATION NEEDS:   No education needs have been identified at this time  Skin:  Skin Assessment: Reviewed RN Assessment  Last BM:  08/03/18  Height:   Ht Readings from Last 1 Encounters:  07/28/18 5\' 4"  (1.626 m)    Weight:   Wt Readings from Last 1 Encounters:  08/05/18 72.9 kg    Ideal Body Weight:  59.1 kg  BMI:  Body mass index is  27.58 kg/m.  Estimated Nutritional Needs:   Kcal:  1750-1950  Protein:  90-105 grams  Fluid:  > 1.7 L    Phillipa Morden A. Mayford Knife, RD, LDN, CDCES Registered Dietitian II Certified Diabetes Care and Education Specialist Pager: [phone] After hours Pager: [phone]

## 2018-08-05 NOTE — Progress Notes (Signed)
[facility] Progress Note      Assessment/Plan Bipolar disorder- per medicine  Perforated descending diverticulitis with 2 intra-abdominal abscesses - S/P IR drain 05/18, cxs pending, gram variable rods currently - if he fails conservative management then he would need a colectomy/colostomy - repeat CT today  FEN -FLD VTE -may have chemical prophylaxis from our standpoint ID -continue zosyn 5/15>>    WBC up to 12.2 today POC - Raynelle Highlandmanda Parsons, wife, [phone]  Foley: none Follow up: TBD  DISPO: overall pt is improving but will await results of repeat CT scan. WBC up slightly     LOS: 8 days    Subjective: CC: abdominal pain  Pain overall improved. He has some pain but it is mild and the pain medicine helps. He is having flatus without increased pain and he is tolerating a FLD without increase in pain. No fever, chills, nausea or vomiting overnight.   Objective: Vital signs in last 24 hours: Temp:  [98 F (36.7 C)-98.7 F (37.1 C)] 98.6 F (37 C) (05/21 0442) Pulse Rate:  [69-85] 69 (05/21 0442) Resp:  [18-20] 20 (05/21 0442) BP: (120-135)/(77-101) 132/88 (05/21 0442) SpO2:  [99 %-100 %] 100 % (05/21 0442) Weight:  [72.9 kg] 72.9 kg (05/21 0442) Last BM Date: 08/03/18  Intake/Output from previous day: 05/20 0701 - 05/21 0700 In: 2130.1 [P.O.:1981; I.V.:5; IV Piggyback:145.1] Out: 38 [Urine:6; Drains:30; Stool:2] Intake/Output this shift: No intake/output data recorded.  PE: Gen:  Alert, NAD, pleasant, cooperative Pulm:  Rate and effort normal Abd: Soft, ND, +BS, drain with minimal serous drainage, LLQ TTP with mild guarding, no peritonitis  Skin: warm and dry   Anti-infectives: Anti-infectives (From admission, onward)   Start     Dose/Rate Route Frequency Ordered Stop   07/30/18 1400  piperacillin-tazobactam (ZOSYN) IVPB 3.375 g     3.375 g 12.5 mL/hr over 240 Minutes Intravenous Every 8 hours 07/30/18 1322     07/30/18 1000   amoxicillin-clavulanate (AUGMENTIN) 875-125 MG per tablet 1 tablet  Status:  Discontinued     1 tablet Oral Every 12 hours 07/30/18 0820 07/30/18 1312   07/30/18 0000  amoxicillin-clavulanate (AUGMENTIN) 875-125 MG tablet     1 tablet Oral Every 12 hours 07/30/18 0913     07/29/18 2000  piperacillin-tazobactam (ZOSYN) IVPB 3.375 g  Status:  Discontinued     3.375 g 12.5 mL/hr over 240 Minutes Intravenous Every 8 hours 07/29/18 1328 07/30/18 0820   07/29/18 0700  ciprofloxacin (CIPRO) IVPB 400 mg  Status:  Discontinued     400 mg 200 mL/hr over 60 Minutes Intravenous Every 12 hours 07/28/18 2215 07/29/18 1327   07/29/18 0400  metroNIDAZOLE (FLAGYL) IVPB 500 mg  Status:  Discontinued     500 mg 100 mL/hr over 60 Minutes Intravenous Every 8 hours 07/28/18 2146 07/29/18 1328   07/28/18 1845  ciprofloxacin (CIPRO) IVPB 400 mg     400 mg 200 mL/hr over 60 Minutes Intravenous  Once 07/28/18 1835 07/28/18 2032   07/28/18 1845  metroNIDAZOLE (FLAGYL) IVPB 500 mg     500 mg 100 mL/hr over 60 Minutes Intravenous  Once 07/28/18 1835 07/28/18 2032      Lab Results:  Recent Labs    08/04/18 0458 08/05/18 0353  WBC 10.5 12.2*  HGB 13.1 12.8*  HCT 38.4* 38.2*  PLT 436* 486*   BMET Recent Labs    08/04/18 0458 08/05/18 0353  NA 138 137  K 3.4* 3.7  CL 103 103  CO2 23 25  GLUCOSE 110* 104*  BUN 9 7  CREATININE 0.78 0.80  CALCIUM 8.7* 8.8*   PT/INR No results for input(s): LABPROT, INR in the last 72 hours. CMP     Component Value Date/Time   NA 137 08/05/2018 0353   K 3.7 08/05/2018 0353   CL 103 08/05/2018 0353   CO2 25 08/05/2018 0353   GLUCOSE 104 (H) 08/05/2018 0353   BUN 7 08/05/2018 0353   CREATININE 0.80 08/05/2018 0353   CALCIUM 8.8 (L) 08/05/2018 0353   PROT 7.0 08/01/2018 0625   ALBUMIN 2.6 (L) 08/01/2018 0625   AST 16 08/01/2018 0625   ALT 19 08/01/2018 0625   ALKPHOS 53 08/01/2018 0625   BILITOT 0.8 08/01/2018 0625   GFRNONAA >60 08/05/2018 0353   GFRAA  >60 08/05/2018 0353   Lipase     Component Value Date/Time   LIPASE 26 07/28/2018 1700    Studies/Results: No results found.    Jerre Simon , [facility] 08/05/2018, 7:55 AM  Pager: [phone] Mon-Wed, Friday 7:00am-4:30pm Thurs 7am-11:30am  Consults: [phone]

## 2018-08-05 NOTE — Progress Notes (Signed)
  Dr.Henn reviewed CT scan done today.  Patient has multiple poorly defined collections.   Recommend surgical intervention.  Jerry Caras Nasia Cannan PA-C 08/05/2018 4:27 PM

## 2018-08-05 NOTE — Progress Notes (Signed)
PROGRESS NOTE    Jay Davis  NWG:[id number] DOB: [birth year] DOA: 07/28/2018 PCP: Patient, No Pcp Per   Brief Narrative:  38 year old male with history of asthma, depression, bipolar disorder, former smoker, marijuana abuse presenting with abdominal pain and found to have acute diverticulitis without abscess or perforation.  In ED, febrile to 102.7 and tachycardic.  Otherwise, hemodynamically stable.  CBC with mild leukocytosis to 14.  Lactic acid, lipase, urinalysis and CMP not impressive.  COVID-19 negative.  CT abdomen and pelvis revealed acute diverticulitis of left colon with surrounding edema but no abscess or perforation.  Admitted for acute diverticulitis on IV Flagyl and Cipro.  Transitioned to IV Zosyn out of concern about his underlying psych issue with Cipro.   On 5/15, pain improved.  Diet advanced.  Later in the day, patient started having severe LLQ pain.  He was restarted on IV fluid, clear liquid diet and IV Zosyn.  KUB did not reveal significant finding other than gas throughout his bowel.  Patient continued to endorse significant abdominal pain.  On 5/17, he had frequent diarrhea and emesis.  Repeat CT abdomen and pelvis was obtained and showed perforated descending colon diverticulitis with 2 intra-abdominal abscesses and partial SBO.  General surgery was consulted and discussed the case with IR.  The consensus was to proceed with drain placement by IR.  Patient had CT-guided drain placement on 08/02/2018.   Assessment & Plan:   Principal Problem:   Acute diverticulitis Active Problems:   Sepsis (HCC)   Asthma   Depression   Perforated descending diverticulitis with 2 intra-abdominal abscesses -CT abd/p on 5/13 with uncomplicated acute diverticulitis of left colon without perforation or abscesses -Repeat CT on 5/17 with perforated descending colon diverticulitis, 2 intra-abdominal abscesses & partial SBO -CT-guided drain placement by IR on 5/18 -General surgery and  IR following. -Cipro and Flagyl 5/13-5/14 - Zosyn 5/14 -- - add fluconazole for yeast on intraabdominal culture from 5/18 (appropriate qt and LFTs) - Plan for repeat CT scan with no substantial change in multifocal/multiloculated L lower quadrant abscess with stable position of the percutaneous drainage catheter since 5/18.  Resolved small bowel distension.  Small bowel loops in the L abdomen with wall thickening, likely 2/2 adjacent inflammatory change. - surgery advancing to full liquid diet.   - Leukocytosis improved. - Discontinue IV fluids - Continue PPI and reglan for now - Blood cultures NGTD x 5 days - Follow abscess culture from 5/18 - gram variable rod, rare pseudomonas and yeast  Mild hyponatremia: Resolved.  Hypokalemia: replace and follow  History of depression/bipolar disorder: Stable.  Not on medication.  History of asthma: Stable -Breathing treatment as needed  Bilateral Pulmonary Nodules measuring up to 7 mm: incidental finding on CT abdomen -Noncontrast chest CT in 3 to 6 months.   -If stable at that time, repeat CT in 18 to 24 months  DVT prophylaxis: SCd Code Status: full  Family Communication: pending Disposition Plan: pending further eval by surgery.  Repeat imaging pending.     Consultants:   GI  IR  Procedures:  5/18 CT guided placement of drainage catheter within lower abdominal abscess  Antimicrobials:  Anti-infectives (From admission, onward)   Start     Dose/Rate Route Frequency Ordered Stop   07/30/18 1400  piperacillin-tazobactam (ZOSYN) IVPB 3.375 g     3.375 g 12.5 mL/hr over 240 Minutes Intravenous Every 8 hours 07/30/18 1322     07/30/18 1000  amoxicillin-clavulanate (AUGMENTIN) 875-125 MG per tablet 1 tablet  Status:  Discontinued     1 tablet Oral Every 12 hours 07/30/18 0820 07/30/18 1312   07/30/18 0000  amoxicillin-clavulanate (AUGMENTIN) 875-125 MG tablet     1 tablet Oral Every 12 hours 07/30/18 0913     07/29/18 2000   piperacillin-tazobactam (ZOSYN) IVPB 3.375 g  Status:  Discontinued     3.375 g 12.5 mL/hr over 240 Minutes Intravenous Every 8 hours 07/29/18 1328 07/30/18 0820   07/29/18 0700  ciprofloxacin (CIPRO) IVPB 400 mg  Status:  Discontinued     400 mg 200 mL/hr over 60 Minutes Intravenous Every 12 hours 07/28/18 2215 07/29/18 1327   07/29/18 0400  metroNIDAZOLE (FLAGYL) IVPB 500 mg  Status:  Discontinued     500 mg 100 mL/hr over 60 Minutes Intravenous Every 8 hours 07/28/18 2146 07/29/18 1328   07/28/18 1845  ciprofloxacin (CIPRO) IVPB 400 mg     400 mg 200 mL/hr over 60 Minutes Intravenous  Once 07/28/18 1835 07/28/18 2032   07/28/18 1845  metroNIDAZOLE (FLAGYL) IVPB 500 mg     500 mg 100 mL/hr over 60 Minutes Intravenous  Once 07/28/18 1835 07/28/18 2032         Subjective: Pain improved. Declined spanish interpreter.  Objective: Vitals:   08/04/18 0410 08/04/18 1715 08/04/18 1938 08/05/18 0442  BP: 131/84 (!) 135/101 120/77 132/88  Pulse: 79 85 79 69  Resp: Temp: 98.2 F (36.8 C) 98 F (36.7 C) 98.7 F (37.1 C) 98.6 F (37 C)  TempSrc: Oral Oral Oral Oral  SpO2: 100% 100% 99% 100%  Weight: 73.6 kg   72.9 kg  Height:        Intake/Output Summary (Last 24 hours) at 08/05/2018 1601 Last data filed at 08/05/2018 1520 Gross per 24 hour  Intake 1985.07 ml  Output 37 ml  Net 1948.07 ml   Filed Weights   08/03/18 0549 08/04/18 0410 08/05/18 0442  Weight: 74 kg 73.6 kg 72.9 kg    Examination:  General: No acute distress. Cardiovascular: Heart sounds show Jay Davis regular rate, and rhythm.  Lungs: Clear to auscultation bilaterally Abdomen: Soft, nontender, nondistended.  Drain in place. Neurological: Alert and oriented 3. Moves all extremities 4. Cranial nerves II through XII grossly intact. Skin: Warm and dry. No rashes or lesions. Extremities: No clubbing or cyanosis. No edema.  Psychiatric: Mood and affect are normal. Insight and judgment are  appropriate.   Data Reviewed: I have personally reviewed following labs and imaging studies  CBC: Recent Labs  Lab 07/31/18 0446 08/02/18 0735 08/03/18 0524 08/04/18 0458 08/05/18 0353  WBC 10.1 14.2* 11.4* 10.5 12.2*  HGB 14.8 13.1 13.7 13.1 12.8*  HCT 42.7 38.7* 40.3 38.4* 38.2*  MCV 86.3 88.0 86.3 86.7 87.0  PLT 302 337 405* 436* 486*   Basic Metabolic Panel: Recent Labs  Lab 08/01/18 0625 08/02/18 0735 08/03/18 0524 08/04/18 0458 08/05/18 0353  NA 134* 137 137 138 137  K 3.7 3.7 3.5 3.4* 3.7  CL 99 104 99 103 103  CO2 21* GLUCOSE 150* 108* 110* 110* 104*  BUN CREATININE 0.74 0.77 0.81 0.78 0.80  CALCIUM 8.8* 8.5* 8.7* 8.7* 8.8*  MG 2.0 2.0 2.1 2.2 2.2   GFR: Estimated Creatinine Clearance: 114.6 mL/min (by C-G formula based on SCr of 0.8 mg/dL). Liver Function Tests: Recent Labs  Lab 08/01/18 0625  AST 16  ALT 19  ALKPHOS 53  BILITOT 0.8  PROT 7.0  ALBUMIN 2.6*   No results for input(s): LIPASE, AMYLASE in the last 168 hours. No results for input(s): AMMONIA in the last 168 hours. Coagulation Profile: Recent Labs  Lab 08/02/18 0735  INR 1.1   Cardiac Enzymes: No results for input(s): CKTOTAL, CKMB, CKMBINDEX, TROPONINI in the last 168 hours. BNP (last 3 results) No results for input(s): PROBNP in the last 8760 hours. HbA1C: No results for input(s): HGBA1C in the last 72 hours. CBG: No results for input(s): GLUCAP in the last 168 hours. Lipid Profile: No results for input(s): CHOL, HDL, LDLCALC, TRIG, CHOLHDL, LDLDIRECT in the last 72 hours. Thyroid Function Tests: No results for input(s): TSH, T4TOTAL, FREET4, T3FREE, THYROIDAB in the last 72 hours. Anemia Panel: No results for input(s): VITAMINB12, FOLATE, FERRITIN, TIBC, IRON, RETICCTPCT in the last 72 hours. Sepsis Labs: No results for input(s): PROCALCITON, LATICACIDVEN in the last 168 hours.  Recent Results (from the past 240 hour(s))  Blood culture (routine  x 2)     Status: None   Collection Time: 07/28/18  7:00 PM  Result Value Ref Range Status   Specimen Description   Final    BLOOD RIGHT ANTECUBITAL Performed at [facility] [place], [address] [place], [place] [address]    Special Requests   Final    BOTTLES DRAWN AEROBIC AND ANAEROBIC Blood Culture adequate volume Performed at [facility] [place], [address] [place], [place] [address]    Culture   Final    NO GROWTH 5 DAYS Performed at [facility], [address] [place], [place] [address]    Report Status 08/02/2018 FINAL  Final  Blood culture (routine x 2)     Status: None   Collection Time: 07/28/18  7:05 PM  Result Value Ref Range Status   Specimen Description   Final    BLOOD RIGHT FOREARM Performed at [facility], [address] [place], [place] [address]    Special Requests   Final    BOTTLES DRAWN AEROBIC AND ANAEROBIC Blood Culture adequate volume Performed at [facility] [place], [address] [place], [place] [address]    Culture   Final    NO GROWTH 5 DAYS Performed at [facility], [address] [place], [place] [address]    Report Status 08/02/2018 FINAL  Final  SARS Coronavirus 2 (Hosp order,Performed in [facility] via [employer] ID)     Status: None   Collection Time: 07/28/18  7:10 PM  Result Value Ref Range Status   SARS Coronavirus 2 ([employer] ID Now) NEGATIVE NEGATIVE Final    Comment: (NOTE) Interpretive Result Comment(s): COVID 19 Positive SARS CoV 2 target nucleic acids are DETECTED. The SARS CoV 2 RNA is generally detectable in upper and lower respiratory specimens during the acute phase of infection.  Positive results are indicative of active infection with SARS CoV 2.  Clinical correlation with patient history and other diagnostic information is necessary to determine patient infection status.  Positive results do not rule out bacterial infection or coinfection with other viruses. The expected  result is Negative. COVID 19 Negative SARS CoV 2 target nucleic acids are NOT DETECTED. The SARS CoV 2 RNA is generally detectable in upper and lower respiratory specimens during the acute phase of infection.  Negative results do not preclude SARS CoV 2 infection, do not rule out coinfections with other pathogens, and should not be used as the sole  basis for treatment or other patient management decisions.  Negative results must be combined with clinical  observations, patient history, and epidemiological information. The expected result is Negative. Invalid Presence or absence of SARS CoV 2 nucleic acids cannot be determined. Repeat testing was performed on the submitted specimen and repeated Invalid results were obtained.  If clinically indicated, additional testing on Nyanna Heideman new specimen with an alternate test methodology [phone]) is advised.  The SARS CoV 2 RNA is generally detectable in upper and lower respiratory specimens during the acute phase of infection. The expected result is Negative. Fact Sheet for Patients:  http://www.graves-ford.org/ Fact Sheet for Healthcare Providers: EnviroConcern.si This test is not yet approved or cleared by the [place] FDA and has been authorized for detection and/or diagnosis of SARS CoV 2 by FDA under an Emergency Use Authorization (EUA).  This EUA will remain in effect (meaning this test can be used) for the duration of the COVID19 d eclaration under Section 564(b)(1) of the Act, 21 U.S.C. section [phone] 3(b)(1), unless the authorization is terminated or revoked sooner. Performed at [facility], [address] [place], [place] [address]   Aerobic/Anaerobic Culture (surgical/deep wound)     Status: None (Preliminary result)   Collection Time: 08/02/18  2:43 PM  Result Value Ref Range Status   Specimen Description ABSCESS  Final   Special Requests DRAINAGE LOWER ABDOMEN  Final    Gram Stain   Final    ABUNDANT WBC PRESENT, PREDOMINANTLY PMN RARE GRAM VARIABLE ROD Performed at [facility], [address] [place], [place] [address]    Culture   Final    RARE PSEUDOMONAS AERUGINOSA RARE YEAST NO ANAEROBES ISOLATED; CULTURE IN PROGRESS FOR 5 DAYS    Report Status PENDING  Incomplete         Radiology Studies: Ct Abdomen Pelvis W Contrast  Result Date: 08/05/2018 CLINICAL DATA:  Diverticulitis. Status post percutaneous drain placement. EXAM: CT ABDOMEN AND PELVIS WITH CONTRAST TECHNIQUE: Multidetector CT imaging of the abdomen and pelvis was performed using the standard protocol following bolus administration of intravenous contrast. CONTRAST:  OMNIPAQUE IOHEXOL 300 MG/ML  SOLN COMPARISON:  08/02/2018 FINDINGS: Lower chest: 7 mm nodule again noted peripheral left lower lobe. 4 mm subpleural left lower lobe nodule visible on 12/05. 4 mm right lower lobe nodule visible on 14/5. Hepatobiliary: No suspicious focal abnormality within the liver parenchyma. There is no evidence for gallstones, gallbladder wall thickening, or pericholecystic fluid. No intrahepatic or extrahepatic biliary dilation. Pancreas: No focal mass lesion. No dilatation of the main duct. No intraparenchymal cyst. No peripancreatic edema. Spleen: No splenomegaly. No focal mass lesion. Adrenals/Urinary Tract: No adrenal nodule or mass. Early excretion of contrast noted in both kidneys with [place] tiny nonobstructing stone evident in the lower pole right kidney. No evidence for hydroureter. Contrast material noted distal right ureter and in the bladder lumen adjacent to the UVJ. Stomach/Bowel: Stomach is unremarkable. No gastric wall thickening. No evidence of outlet obstruction. Duodenum is normally positioned as is the ligament of Treitz. No small bowel obstruction. The terminal ileum is normal. The appendix is normal. Diverticular changes noted left colon with colonic wall thickening. There is extensive  pericolonic edema/inflammation and adjacent small bowel loops show wall thickening, likely reactive. Vascular/Lymphatic: No abdominal aortic aneurysm. There is no gastrohepatic or hepatoduodenal ligament lymphadenopathy. No intraperitoneal or retroperitoneal lymphadenopathy. No pelvic sidewall lymphadenopathy. Reproductive: The prostate gland and seminal vesicles are unremarkable. Other: Trace free intraperitoneal fluid.  Extensive edema/inflammation noted in the left lower quadrant. Left lower quadrant percutaneous drainage catheter is stable with persistent multifocal abscess pockets evident. 1.4 x 2.0 x 6.2 cm irregular rim enhancing collection of gas and fluid is seen tracking up the left abdomen anterior to the descending colon. 2.1 x 4.9 x 2.1 cm rim enhancing collection of fluid and gas is identified just deep to the left rectus muscle. Other scattered small collections of gas and fluid are seen in the left lower quadrant. Musculoskeletal: No worrisome lytic or sclerotic osseous abnormality. IMPRESSION: 1. No substantial change in the multifocal/multiloculated left lower quadrant abscess with stable position of the percutaneous drainage catheter since 08/02/2018. 2. Small-bowel distention seen on 08/01/2018 has resolved in the interval. There are small bowel loops in the left abdomen the demonstrate wall thickening, likely secondary to the adjacent inflammatory change. 3. Bilateral pulmonary nodules measuring up to 7 mm. Non-contrast chest CT at 3-6 months is recommended. If the nodules are stable at time of repeat CT, then future CT at 18-24 months (from today's scan) is considered optional for low-risk patients, but is recommended for high-risk patients. This recommendation follows the consensus statement: Guidelines for Management of Incidental Pulmonary Nodules Detected on CT Images: From the Fleischner Society 2017; Radiology 2017; 284:228-243. Electronically Signed   By: Kennith Center M.D.   On:  08/05/2018 13:28        Scheduled Meds:  feeding supplement (ENSURE ENLIVE)  237 mL Oral BID BM   metoCLOPramide (REGLAN) injection  10 mg Intravenous Q8H   multivitamin with minerals  1 tablet Oral Daily   pantoprazole (PROTONIX) IV  40 mg Intravenous Q24H   sodium chloride flush  5 mL Intracatheter Q8H   Continuous Infusions:  sodium chloride Stopped (08/03/18 1038)   piperacillin-tazobactam (ZOSYN)  IV 3.375 g (08/05/18 1400)     LOS: 8 days    Time spent: over 30 min    Lacretia Nicks, MD [facility] Pager AMION  If 7PM-7AM, please contact night-coverage [email or web address] Password [facility] 08/05/2018, 4:01 PM

## 2018-08-05 NOTE — Plan of Care (Signed)
  Problem: Education: Goal: Knowledge of General Education information will improve Description: Including pain rating scale, medication(s)/side effects and non-pharmacologic comfort measures Outcome: Progressing   Problem: Health Behavior/Discharge Planning: Goal: Ability to manage health-related needs will improve Outcome: Progressing   Problem: Clinical Measurements: Goal: Will remain free from infection Outcome: Progressing   

## 2018-08-05 NOTE — Progress Notes (Signed)
Pharmacy Antibiotic Note  Jay Davis is a 38 y.o. male admitted on 07/28/2018 with Abdominal pain, fever with acute diverticulitis.  Pharmacy has been consulted for Zosyn and Diflucan dosing.  CC/HPI: Abdominal pain, fever CT: Acute diverticulitis of the left colon with surrounding edema but no abscess or perforation.  PMH: asthma, depression, bipolar, former smoker, marijuana abuse  ID: Previous Cipro/Flagyl for diverticulitis. LA WNL. PC normal. WBC 13.9>15.5>12 Scr WNL.   Antimicrobials this admission: 5/13 cipro>5/14 5/13 flagyl> 5/14 5/14 Zosyn>>  5/21 diflucan >   Microbiology results: 5/18 abscess rare pseudomonas, rare yeast 5/13 blood x2>>ngtd 5/13: COVID negative   Plan: Zosyn 3.375g IV q 8 hrs. Diflucan 400mg  q24h  Leota Sauers Pharm.D. CPP, BCPS Clinical Pharmacist [phone] 08/05/2018 5:12 PM

## 2018-08-06 LAB — CBC
HCT: 40.3 % (ref 39.0–52.0)
Hemoglobin: 13.9 g/dL (ref 13.0–17.0)
MCH: 29.4 pg (ref 26.0–34.0)
MCHC: 34.5 g/dL (ref 30.0–36.0)
MCV: 85.2 fL (ref 80.0–100.0)
Platelets: 515 10*3/uL — ABNORMAL HIGH (ref 150–400)
RBC: 4.73 MIL/uL (ref 4.22–5.81)
RDW: 12 % (ref 11.5–15.5)
WBC: 11 10*3/uL — ABNORMAL HIGH (ref 4.0–10.5)
nRBC: 0 % (ref 0.0–0.2)

## 2018-08-06 LAB — MAGNESIUM: Magnesium: 2.3 mg/dL (ref 1.7–2.4)

## 2018-08-06 LAB — BASIC METABOLIC PANEL
Anion gap: 12 (ref 5–15)
BUN: 6 mg/dL (ref 6–20)
CO2: 24 mmol/L (ref 22–32)
Calcium: 9.1 mg/dL (ref 8.9–10.3)
Chloride: 103 mmol/L (ref 98–111)
Creatinine, Ser: 0.8 mg/dL (ref 0.61–1.24)
GFR calc Af Amer: >60 mL/min (ref >60)
GFR calc non Af Amer: >60 mL/min (ref >60)
Glucose, Bld: 99 mg/dL (ref 70–99)
Potassium: 3.6 mmol/L (ref 3.5–5.1)
Sodium: 139 mmol/L (ref 135–145)

## 2018-08-06 MED ORDER — OXYCODONE HCL 5 MG PO TABS: 5.0000 mg | ORAL_TABLET | ORAL | Status: DC | PRN

## 2018-08-06 NOTE — Progress Notes (Signed)
[facility]/Trauma Progress Note      Assessment/Plan Bipolar disorder- per medicine  Perforated descending diverticulitis with 2 intra-abdominal abscesses - S/P IR drain 05/18, cxs pending, gram variable rods currently - if he fails conservative management then he would need a colectomy/colostomy - repeat CT 05/21 showed improved small bowel distention otherwise relatively unchanged.   FEN -soft diet  VTE -may have chemical prophylaxis from our standpoint ID -continue zosyn 5/15>>    WBC down to 11.0 today POC - Raynelle Highland, wife, [phone] Foley: none Follow up: TBD  DISPO: overall pt is improving, WBC back down slightly, no pain on exam, no pain medicine in 24hrs. His exam does not correlate with his CT findings but if he tolerates a soft diet without increase in pain or WBC, then we might avoid emergent surgery this admission. Possibly home tomorrow if he continues to do well.    LOS: 9 days    Subjective: CC: no complaints  Pt states no abdominal pain. No nausea, vomiting, fever or chills. He is tolerating a FLD. He wants to go home.   Objective: Vital signs in last 24 hours: Temp:  [97.9 F (36.6 C)-98.2 F (36.8 C)] 97.9 F (36.6 C) (05/22 0550) Pulse Rate:  [70-82] 70 (05/22 0550) Resp:  [18-20] 20 (05/22 0550) BP: (113-126)/(66-83) 113/66 (05/22 0550) SpO2:  [100 %] 100 % (05/22 0550) Weight:  [72.3 kg] 72.3 kg (05/22 0550) Last BM Date: 08/05/18  Intake/Output from previous day: 05/21 0701 - 05/22 0700 In: 1040 [P.O.:690; IV Piggyback:350] Out: 22 [Drains:20; Stool:2] Intake/Output this shift: No intake/output data recorded.  PE: Gen:  Alert, NAD, pleasant, cooperative Pulm:  Rate and effort normal Abd: Soft, ND, drain with minimal tan purulent drainage, no TTP.  Skin: warm and dry  Anti-infectives: Anti-infectives (From admission, onward)   Start     Dose/Rate Route Frequency Ordered Stop   08/05/18 1800  fluconazole  (DIFLUCAN) IVPB 400 mg     400 mg 100 mL/hr over 120 Minutes Intravenous Every 24 hours 08/05/18 1709     07/30/18 1400  piperacillin-tazobactam (ZOSYN) IVPB 3.375 g     3.375 g 12.5 mL/hr over 240 Minutes Intravenous Every 8 hours 07/30/18 1322     07/30/18 1000  amoxicillin-clavulanate (AUGMENTIN) 875-125 MG per tablet 1 tablet  Status:  Discontinued     1 tablet Oral Every 12 hours 07/30/18 0820 07/30/18 1312   07/30/18 0000  amoxicillin-clavulanate (AUGMENTIN) 875-125 MG tablet     1 tablet Oral Every 12 hours 07/30/18 0913     07/29/18 2000  piperacillin-tazobactam (ZOSYN) IVPB 3.375 g  Status:  Discontinued     3.375 g 12.5 mL/hr over 240 Minutes Intravenous Every 8 hours 07/29/18 1328 07/30/18 0820   07/29/18 0700  ciprofloxacin (CIPRO) IVPB 400 mg  Status:  Discontinued     400 mg 200 mL/hr over 60 Minutes Intravenous Every 12 hours 07/28/18 2215 07/29/18 1327   07/29/18 0400  metroNIDAZOLE (FLAGYL) IVPB 500 mg  Status:  Discontinued     500 mg 100 mL/hr over 60 Minutes Intravenous Every 8 hours 07/28/18 2146 07/29/18 1328   07/28/18 1845  ciprofloxacin (CIPRO) IVPB 400 mg     400 mg 200 mL/hr over 60 Minutes Intravenous  Once 07/28/18 1835 07/28/18 2032   07/28/18 1845  metroNIDAZOLE (FLAGYL) IVPB 500 mg     500 mg 100 mL/hr over 60 Minutes Intravenous  Once 07/28/18 1835 07/28/18 2032      Lab Results:  Recent Labs    08/05/18 0353 08/06/18 0421  WBC 12.2* 11.0*  HGB 12.8* 13.9  HCT 38.2* 40.3  PLT 486* 515*   BMET Recent Labs    08/05/18 0353 08/06/18 0421  NA 137 139  K 3.7 3.6  CL 103 103  CO2 25 24  GLUCOSE 104* 99  BUN 7 6  CREATININE 0.80 0.80  CALCIUM 8.8* 9.1   PT/INR No results for input(s): LABPROT, INR in the last 72 hours. CMP     Component Value Date/Time   NA 139 08/06/2018 0421   K 3.6 08/06/2018 0421   CL 103 08/06/2018 0421   CO2 24 08/06/2018 0421   GLUCOSE 99 08/06/2018 0421   BUN 6 08/06/2018 0421   CREATININE 0.80  08/06/2018 0421   CALCIUM 9.1 08/06/2018 0421   PROT 7.0 08/01/2018 0625   ALBUMIN 2.6 (L) 08/01/2018 0625   AST 16 08/01/2018 0625   ALT 19 08/01/2018 0625   ALKPHOS 53 08/01/2018 0625   BILITOT 0.8 08/01/2018 0625   GFRNONAA >60 08/06/2018 0421   GFRAA >60 08/06/2018 0421   Lipase     Component Value Date/Time   LIPASE 26 07/28/2018 1700    Studies/Results: Ct Abdomen Pelvis W Contrast  Result Date: 08/05/2018 CLINICAL DATA:  Diverticulitis. Status post percutaneous drain placement. EXAM: CT ABDOMEN AND PELVIS WITH CONTRAST TECHNIQUE: Multidetector CT imaging of the abdomen and pelvis was performed using the standard protocol following bolus administration of intravenous contrast. CONTRAST:  OMNIPAQUE IOHEXOL 300 MG/ML  SOLN COMPARISON:  08/02/2018 FINDINGS: Lower chest: 7 mm nodule again noted peripheral left lower lobe. 4 mm subpleural left lower lobe nodule visible on 12/05. 4 mm right lower lobe nodule visible on 14/5. Hepatobiliary: No suspicious focal abnormality within the liver parenchyma. There is no evidence for gallstones, gallbladder wall thickening, or pericholecystic fluid. No intrahepatic or extrahepatic biliary dilation. Pancreas: No focal mass lesion. No dilatation of the main duct. No intraparenchymal cyst. No peripancreatic edema. Spleen: No splenomegaly. No focal mass lesion. Adrenals/Urinary Tract: No adrenal nodule or mass. Early excretion of contrast noted in both kidneys with a tiny nonobstructing stone evident in the lower pole right kidney. No evidence for hydroureter. Contrast material noted distal right ureter and in the bladder lumen adjacent to the UVJ. Stomach/Bowel: Stomach is unremarkable. No gastric wall thickening. No evidence of outlet obstruction. Duodenum is normally positioned as is the ligament of Treitz. No small bowel obstruction. The terminal ileum is normal. The appendix is normal. Diverticular changes noted left colon with colonic wall  thickening. There is extensive pericolonic edema/inflammation and adjacent small bowel loops show wall thickening, likely reactive. Vascular/Lymphatic: No abdominal aortic aneurysm. There is no gastrohepatic or hepatoduodenal ligament lymphadenopathy. No intraperitoneal or retroperitoneal lymphadenopathy. No pelvic sidewall lymphadenopathy. Reproductive: The prostate gland and seminal vesicles are unremarkable. Other: Trace free intraperitoneal fluid. Extensive edema/inflammation noted in the left lower quadrant. Left lower quadrant percutaneous drainage catheter is stable with persistent multifocal abscess pockets evident. 1.4 x 2.0 x 6.2 cm irregular rim enhancing collection of gas and fluid is seen tracking up the left abdomen anterior to the descending colon. 2.1 x 4.9 x 2.1 cm rim enhancing collection of fluid and gas is identified just deep to the left rectus muscle. Other scattered small collections of gas and fluid are seen in the left lower quadrant. Musculoskeletal: No worrisome lytic or sclerotic osseous abnormality. IMPRESSION: 1. No substantial change in the multifocal/multiloculated left lower quadrant abscess with stable position  of the percutaneous drainage catheter since 08/02/2018. 2. Small-bowel distention seen on 08/01/2018 has resolved in the interval. There are small bowel loops in the left abdomen the demonstrate wall thickening, likely secondary to the adjacent inflammatory change. 3. Bilateral pulmonary nodules measuring up to 7 mm. Non-contrast chest CT at 3-6 months is recommended. If the nodules are stable at time of repeat CT, then future CT at 18-24 months (from today's scan) is considered optional for low-risk patients, but is recommended for high-risk patients. This recommendation follows the consensus statement: Guidelines for Management of Incidental Pulmonary Nodules Detected on CT Images: From the Fleischner Society 2017; Radiology 2017; 284:228-243. Electronically Signed   By:  Kennith CenterEric  Mansell M.D.   On: 08/05/2018 13:28      Jerre SimonJessica L Focht , [facility] NussaA-C [facility] 08/06/2018, 8:06 AM  Pager: [phone] Mon-Wed, Friday 7:00am-4:30pm Thurs 7am-11:30am  Consults: [phone]

## 2018-08-06 NOTE — Plan of Care (Signed)

## 2018-08-06 NOTE — Progress Notes (Signed)
PROGRESS NOTE  Jay Davis ZOX:[id number] DOB: 07-05-1980 DOA: 07/28/2018 PCP: Patient, No Pcp Per  HPI/Recap of past 66 hours: 38 year old male with history of asthma, depression, bipolar disorder, former smoker, marijuana abuse presenting with abdominal pain and found to have acute diverticulitis without abscess or perforation.  In ED, febrile to 102.7 and tachycardic. Otherwise, hemodynamically stable. CBC with mild leukocytosis to 14. Lactic acid, lipase, urinalysis and CMP not impressive. COVID-19 negative. CT abdomen and pelvis revealed acute diverticulitis of left colon with surrounding edema but no abscess or perforation.  Admitted for acute diverticulitis on IV Flagyl and Cipro. Transitioned to IV Zosyn out of concern about his underlying psych issue with Cipro.   On 5/15, pain improved. Diet advanced. Later in the day, patient started having severe LLQ pain. He was restarted on IV fluid, clear liquid diet and IV Zosyn. KUB did not reveal significant finding other than gas throughout his bowel. Patient continued to endorse significant abdominal pain. On 5/17, he had frequent diarrhea and emesis. Repeat CT abdomen and pelvis was obtained and showed perforated descending colon diverticulitis with 2 intra-abdominal abscesses and partial SBO. General surgery was consulted and discussed the case with IR. The consensus was to proceed with drain placement by IR. Patient had CT-guided drain placement on 08/02/2018.   08/06/18: Patient seen and examined at his bedside.  No acute events overnight.  States his abdominal pain is better.  He denies any nausea.  Has had bowel movements.  Repeat CT abdomen and pelvis done on 08/05/2018 shows multiple poorly defined collection, I have recommended surgical intervention.  Appears to be doing ok clinically.  Tolerating a diet well which will be advanced to soft today per general surgery.  Assessment/Plan: Principal Problem:   Acute  diverticulitis Active Problems:   Sepsis (HCC)   Asthma   Depression  Perforated descending diverticulitis with 2 intra-abdominal abscesses status post CT guided drain placement on 08/01/2016 by interventional radiology. Repeat CT done on 08/05/2018 showed multiple poorly defined collections Patient denies abdominal pain or nausea Tolerating his diet well which is been advanced to soft diet per general surgery Plan is to discharge home possibly tomorrow with drain and will follow-up with general surgery outpatient  General surgery and IR following Currently on Zosyn since 07/29/2018 Blood cultures x2- to date Abscess culture from 518 showed gram variable rods and rare of Pseudomonas and yeast Also on fluconazole IV which was added on 08/02/2018 Leukocytosis improving WBC 11 K from 12 K yesterday Vital signs reviewed and are stable  Chronic depression/bipolar disorder Stable Not on medications  Bilateral pulmonary nodules measuring up to 7 mm Incidental finding on CT abdomen Recommend noncontrast chest CT in 3 to 6 months If stable at that time repeat CT in 18 to 24 months  History of asthma Stable Breathing treatment as needed   DVT prophylaxis: SCd Code Status: full  Family Communication: pending Disposition Plan: pending further eval by surgery.  Repeat imaging pending.     Consultants:   GI  IR  Procedures:  5/18 CT guided placement of drainage catheter within lower abdominal abscess   Objective: Vitals:   08/05/18 1937 08/06/18 0550 08/06/18 1124 08/06/18 1152  BP: 126/83 113/66 115/80 101/73  Pulse: 82 70 67 67  Resp: Temp: 98.2 F (36.8 C) 97.9 F (36.6 C) 98.2 F (36.8 C) 98.5 F (36.9 C)  TempSrc: Oral Oral Oral Oral  SpO2: 100% 100% 96% 99%  Weight:  72.3  kg    Height:        Intake/Output Summary (Last 24 hours) at 08/06/2018 1216 Last data filed at 08/06/2018 0700 Gross per 24 hour  Intake 770 ml  Output 22 ml  Net 748 ml    Filed Weights   08/04/18 0410 08/05/18 0442 08/06/18 0550  Weight: 73.6 kg 72.9 kg 72.3 kg    Exam:  . General: 38 y.o. year-old male well developed well nourished in no acute distress.  Alert and oriented x3. . Cardiovascular: Regular rate and rhythm with no rubs or gallops.  No thyromegaly or JVD noted.   Marland Kitchen Respiratory: Clear to auscultation with no wheezes or rales. Good inspiratory effort. . Abdomen: Soft nontender nondistended with normal bowel sounds.  2 drain tubes in place in the left upper quadrant abdomen with light greenish fluid. . Musculoskeletal: No lower extremity edema. 2/4 pulses in all 4 extremities. Marland Kitchen Psychiatry: Mood is appropriate for condition and setting   Data Reviewed: CBC: Recent Labs  Lab 08/02/18 0735 08/03/18 0524 08/04/18 0458 08/05/18 0353 08/06/18 0421  WBC 14.2* 11.4* 10.5 12.2* 11.0*  HGB 13.1 13.7 13.1 12.8* 13.9  HCT 38.7* 40.3 38.4* 38.2* 40.3  MCV 88.0 86.3 86.7 87.0 85.2  PLT 337 405* 436* 486* 515*   Basic Metabolic Panel: Recent Labs  Lab 08/02/18 0735 08/03/18 0524 08/04/18 0458 08/05/18 0353 08/06/18 0421  NA 137 137 138 137 139  K 3.7 3.5 3.4* 3.7 3.6  CL 104 99 103 103 103  CO2 GLUCOSE 108* 110* 110* 104* 99  BUN CREATININE 0.77 0.81 0.78 0.80 0.80  CALCIUM 8.5* 8.7* 8.7* 8.8* 9.1  MG 2.0 2.1 2.2 2.2 2.3   GFR: Estimated Creatinine Clearance: 114 mL/min (by C-G formula based on SCr of 0.8 mg/dL). Liver Function Tests: Recent Labs  Lab 08/01/18 0625  AST 16  ALT 19  ALKPHOS 53  BILITOT 0.8  PROT 7.0  ALBUMIN 2.6*   No results for input(s): LIPASE, AMYLASE in the last 168 hours. No results for input(s): AMMONIA in the last 168 hours. Coagulation Profile: Recent Labs  Lab 08/02/18 0735  INR 1.1   Cardiac Enzymes: No results for input(s): CKTOTAL, CKMB, CKMBINDEX, TROPONINI in the last 168 hours. BNP (last 3 results) No results for input(s): PROBNP in the last 8760 hours.  HbA1C: No results for input(s): HGBA1C in the last 72 hours. CBG: No results for input(s): GLUCAP in the last 168 hours. Lipid Profile: No results for input(s): CHOL, HDL, LDLCALC, TRIG, CHOLHDL, LDLDIRECT in the last 72 hours. Thyroid Function Tests: No results for input(s): TSH, T4TOTAL, FREET4, T3FREE, THYROIDAB in the last 72 hours. Anemia Panel: No results for input(s): VITAMINB12, FOLATE, FERRITIN, TIBC, IRON, RETICCTPCT in the last 72 hours. Urine analysis:    Component Value Date/Time   COLORURINE YELLOW 07/28/2018 1641   APPEARANCEUR CLEAR 07/28/2018 1641   LABSPEC 1.025 07/28/2018 1641   PHURINE 6.0 07/28/2018 1641   GLUCOSEU NEGATIVE 07/28/2018 1641   HGBUR TRACE (A) 07/28/2018 1641   BILIRUBINUR NEGATIVE 07/28/2018 1641   KETONESUR NEGATIVE 07/28/2018 1641   PROTEINUR NEGATIVE 07/28/2018 1641   NITRITE NEGATIVE 07/28/2018 1641   LEUKOCYTESUR NEGATIVE 07/28/2018 1641   Sepsis Labs: (procalcitonin:4,lacticidven:4)  ) Recent Results (from the past 240 hour(s))  Blood culture (routine x 2)     Status: None   Collection Time: 07/28/18  7:00 PM  Result Value Ref Range Status  Specimen Description   Final    BLOOD RIGHT ANTECUBITAL Performed at [facility], [address] [place], [place] [address]    Special Requests   Final    BOTTLES DRAWN AEROBIC AND ANAEROBIC Blood Culture adequate volume Performed at [facility], [address] [place], [place] [address]    Culture   Final    NO GROWTH 5 DAYS Performed at [facility], [address], [place], [place] [address]    Report Status 08/02/2018 FINAL  Final  Blood culture (routine x 2)     Status: None   Collection Time: 07/28/18  7:05 PM  Result Value Ref Range Status   Specimen Description   Final    BLOOD RIGHT FOREARM Performed at [facility], [address], [place], [place] [address]    Special Requests   Final    BOTTLES DRAWN AEROBIC AND ANAEROBIC  Blood Culture adequate volume Performed at [facility], [address] [place], [place] [address]    Culture   Final    NO GROWTH 5 DAYS Performed at [facility], [address], [place], [place] [address]    Report Status 08/02/2018 FINAL  Final  SARS Coronavirus 2 (Hosp order,Performed in [facility] lab via [employer])     Status: None   Collection Time: 07/28/18  7:10 PM  Result Value Ref Range Status   SARS Coronavirus 2 ([employer] Now) NEGATIVE NEGATIVE Final    Comment: (NOTE) Interpretive Result Comment(s): COVID 19 Positive SARS CoV 2 target nucleic acids are DETECTED. The SARS CoV 2 RNA is generally detectable in upper and lower respiratory specimens during the acute phase of infection.  Positive results are indicative of active infection with SARS CoV 2.  Clinical correlation with patient history and other diagnostic information is necessary to determine patient infection status.  Positive results do not rule out bacterial infection or coinfection with other viruses. The expected result is Negative. COVID 19 Negative SARS CoV 2 target nucleic acids are NOT DETECTED. The SARS CoV 2 RNA is generally detectable in upper and lower respiratory specimens during the acute phase of infection.  Negative results do not preclude SARS CoV 2 infection, do not rule out coinfections with other pathogens, and should not be used as the sole basis for treatment or other patient management decisions.  Negative results must be combined with clinical  observations, patient history, and epidemiological information. The expected result is Negative. Invalid Presence or absence of SARS CoV 2 nucleic acids cannot be determined. Repeat testing was performed on the submitted specimen and repeated Invalid results were obtained.  If clinically indicated, additional testing on a new specimen with an alternate test methodology [phone]) is advised.  The SARS CoV 2 RNA is  generally detectable in upper and lower respiratory specimens during the acute phase of infection. The expected result is Negative. Fact Sheet for Patients:  http://www.graves-ford.org/ Fact Sheet for Healthcare Providers: EnviroConcern.si This test is not yet approved or cleared by the [place] FDA and has been authorized for detection and/or diagnosis of SARS CoV 2 by FDA under an Emergency Use Authorization (EUA).  This EUA will remain in effect (meaning this test can be used) for the duration of the COVID19 d eclaration under Section 564(b)(1) of the Act, 21 U.S.C. section 9791074455 3(b)(1), unless the authorization is terminated or revoked sooner. Performed at [facility], [address] [place], [place] [address]   Aerobic/Anaerobic Culture (surgical/deep wound)     Status: None (Preliminary result)   Collection Time: 08/02/18  2:43 PM  Result Value Ref Range Status   Specimen Description ABSCESS  Final   Special Requests DRAINAGE LOWER ABDOMEN  Final   Gram Stain   Final    ABUNDANT WBC PRESENT, PREDOMINANTLY PMN RARE GRAM VARIABLE ROD Performed at [facility], [address], [place], [place] [address]    Culture   Final    RARE PSEUDOMONAS AERUGINOSA RARE CANDIDA ALBICANS NO ANAEROBES ISOLATED; CULTURE IN PROGRESS FOR 5 DAYS    Report Status PENDING  Incomplete   Organism ID, Bacteria PSEUDOMONAS AERUGINOSA  Final      Susceptibility   Pseudomonas aeruginosa - MIC*    CEFTAZIDIME <=1 SENSITIVE Sensitive     CIPROFLOXACIN <=0.25 SENSITIVE Sensitive     GENTAMICIN <=1 SENSITIVE Sensitive     IMIPENEM 1 SENSITIVE Sensitive     PIP/TAZO <=4 SENSITIVE Sensitive     CEFEPIME <=1 SENSITIVE Sensitive     * RARE PSEUDOMONAS AERUGINOSA      Studies: Ct Abdomen Pelvis W Contrast  Result Date: 08/05/2018 CLINICAL DATA:  Diverticulitis. Status post percutaneous drain placement. EXAM: CT ABDOMEN AND PELVIS WITH  CONTRAST TECHNIQUE: Multidetector CT imaging of the abdomen and pelvis was performed using the standard protocol following bolus administration of intravenous contrast. CONTRAST:  OMNIPAQUE IOHEXOL 300 MG/ML  SOLN COMPARISON:  08/02/2018 FINDINGS: Lower chest: 7 mm nodule again noted peripheral left lower lobe. 4 mm subpleural left lower lobe nodule visible on 12/05. 4 mm right lower lobe nodule visible on 14/5. Hepatobiliary: No suspicious focal abnormality within the liver parenchyma. There is no evidence for gallstones, gallbladder wall thickening, or pericholecystic fluid. No intrahepatic or extrahepatic biliary dilation. Pancreas: No focal mass lesion. No dilatation of the main duct. No intraparenchymal cyst. No peripancreatic edema. Spleen: No splenomegaly. No focal mass lesion. Adrenals/Urinary Tract: No adrenal nodule or mass. Early excretion of contrast noted in both kidneys with a tiny nonobstructing stone evident in the lower pole right kidney. No evidence for hydroureter. Contrast material noted distal right ureter and in the bladder lumen adjacent to the UVJ. Stomach/Bowel: Stomach is unremarkable. No gastric wall thickening. No evidence of outlet obstruction. Duodenum is normally positioned as is the ligament of Treitz. No small bowel obstruction. The terminal ileum is normal. The appendix is normal. Diverticular changes noted left colon with colonic wall thickening. There is extensive pericolonic edema/inflammation and adjacent small bowel loops show wall thickening, likely reactive. Vascular/Lymphatic: No abdominal aortic aneurysm. There is no gastrohepatic or hepatoduodenal ligament lymphadenopathy. No intraperitoneal or retroperitoneal lymphadenopathy. No pelvic sidewall lymphadenopathy. Reproductive: The prostate gland and seminal vesicles are unremarkable. Other: Trace free intraperitoneal fluid. Extensive edema/inflammation noted in the left lower quadrant. Left lower quadrant percutaneous  drainage catheter is stable with persistent multifocal abscess pockets evident. 1.4 x 2.0 x 6.2 cm irregular rim enhancing collection of gas and fluid is seen tracking up the left abdomen anterior to the descending colon. 2.1 x 4.9 x 2.1 cm rim enhancing collection of fluid and gas is identified just deep to the left rectus muscle. Other scattered small collections of gas and fluid are seen in the left lower quadrant. Musculoskeletal: No worrisome lytic or sclerotic osseous abnormality. IMPRESSION: 1. No substantial change in the multifocal/multiloculated left lower quadrant abscess with stable position of the percutaneous drainage catheter since 08/02/2018. 2. Small-bowel distention seen  on 08/01/2018 has resolved in the interval. There are small bowel loops in the left abdomen the demonstrate wall thickening, likely secondary to the adjacent inflammatory change. 3. Bilateral pulmonary nodules measuring up to 7 mm. Non-contrast chest CT at 3-6 months is recommended. If the nodules are stable at time of repeat CT, then future CT at 18-24 months (from today's scan) is considered optional for low-risk patients, but is recommended for high-risk patients. This recommendation follows the consensus statement: Guidelines for Management of Incidental Pulmonary Nodules Detected on CT Images: From the Fleischner Society 2017; Radiology 2017; 284:228-243. Electronically Signed   By: Kennith CenterEric  Mansell M.D.   On: 08/05/2018 13:28    Scheduled Meds: . feeding supplement (ENSURE ENLIVE)  237 mL Oral BID BM  . metoCLOPramide (REGLAN) injection  10 mg Intravenous Q8H  . multivitamin with minerals  1 tablet Oral Daily  . pantoprazole (PROTONIX) IV  40 mg Intravenous Q24H  . sodium chloride flush  5 mL Intracatheter Q8H    Continuous Infusions: . sodium chloride Stopped (08/03/18 1038)  . fluconazole (DIFLUCAN) IV 400 mg (08/05/18 2152)  . piperacillin-tazobactam (ZOSYN)  IV 3.375 g (08/06/18 0542)     LOS: 9 days      Darlin Droparole N Hall, MD [facility] Pager [phone]  If 7PM-7AM, please contact night-coverage [email or web address] Password [email or web address] 08/06/2018, 12:16 PM

## 2018-08-06 NOTE — Progress Notes (Signed)
Patient ID: Jay Davis, male   DOB: [birth year], 38 y.o.   MRN: [id number]   IR rounding note via telephone per new regulations. Spoke with Barbie, Charity fundraiser.  Patient with history of diverticulitis with free air on CT s/p intraabdominal drain placement 08/02/2018 by Dr. Lowella Dandy.  CT yesterday  IMPRESSION: 1. No substantial change in the multifocal/multiloculated left lower quadrant abscess with stable position of the percutaneous drainage catheter since 08/02/2018. 2. Small-bowel distention seen on 08/01/2018 has resolved in the interval. There are small bowel loops in the left abdomen the demonstrate wall thickening, likely secondary to the adjacent inflammatory change. 3. Bilateral pulmonary nodules measuring up to 7 mm. Non-contrast chest CT at 3-6 months is recommended. If the nodules are stable at time of repeat CT, then future CT at 18-24 months (from today's Scan)  Dr Lowella Dandy reviewed imaging Rec: surgical intervention- if deemed appropriate per CCS  OP 20 cc yesterday Organism ID, Bacteria PSEUDOMONAS AERUGINOSA    afebl wbc 11 today  [facility] has seen pt today-- plan for home with drain possibly tomorrow  Rec:  Should flush drain daily with 10 cc sterile saline Record OP IR scheduler will call pt with time and date of follow up as Outpatient Order in place

## 2018-08-06 NOTE — Plan of Care (Signed)

## 2018-08-07 LAB — BASIC METABOLIC PANEL
Anion gap: 11 (ref 5–15)
BUN: 11 mg/dL (ref 6–20)
CO2: 23 mmol/L (ref 22–32)
Calcium: 9 mg/dL (ref 8.9–10.3)
Chloride: 103 mmol/L (ref 98–111)
Creatinine, Ser: 0.82 mg/dL (ref 0.61–1.24)
GFR calc Af Amer: >60 mL/min (ref >60)
GFR calc non Af Amer: >60 mL/min (ref >60)
Glucose, Bld: 103 mg/dL — ABNORMAL HIGH (ref 70–99)
Potassium: 3.6 mmol/L (ref 3.5–5.1)
Sodium: 137 mmol/L (ref 135–145)

## 2018-08-07 LAB — AEROBIC/ANAEROBIC CULTURE W GRAM STAIN (SURGICAL/DEEP WOUND)

## 2018-08-07 LAB — CBC
HCT: 40.2 % (ref 39.0–52.0)
Hemoglobin: 13.4 g/dL (ref 13.0–17.0)
MCH: 29 pg (ref 26.0–34.0)
MCHC: 33.3 g/dL (ref 30.0–36.0)
MCV: 87 fL (ref 80.0–100.0)
Platelets: 529 10*3/uL — ABNORMAL HIGH (ref 150–400)
RBC: 4.62 MIL/uL (ref 4.22–5.81)
RDW: 12.3 % (ref 11.5–15.5)
WBC: 8.5 10*3/uL (ref 4.0–10.5)
nRBC: 0 % (ref 0.0–0.2)

## 2018-08-07 MED ORDER — ADULT MULTIVITAMIN W/MINERALS CH: 1.0000 | ORAL_TABLET | Freq: Every day | ORAL | 0 refills | Status: DC

## 2018-08-07 MED ORDER — OXYCODONE HCL 5 MG PO TABS: 5.0000 mg | ORAL_TABLET | Freq: Two times a day (BID) | ORAL | 0 refills | Status: DC | PRN

## 2018-08-07 MED ORDER — METRONIDAZOLE 500 MG PO TABS: 500.0000 mg | ORAL_TABLET | Freq: Three times a day (TID) | ORAL | 0 refills | Status: DC

## 2018-08-07 MED ORDER — PANTOPRAZOLE SODIUM 40 MG PO TBEC: 40.0000 mg | DELAYED_RELEASE_TABLET | Freq: Every day | ORAL | 0 refills | Status: DC

## 2018-08-07 MED ORDER — ADULT MULTIVITAMIN W/MINERALS CH: 1.0000 | ORAL_TABLET | Freq: Every day | ORAL | 0 refills | Status: AC

## 2018-08-07 MED ORDER — CIPROFLOXACIN HCL 500 MG PO TABS
500.0000 mg | ORAL_TABLET | Freq: Two times a day (BID) | ORAL | Status: DC
  Administered 2018-08-07: 500 mg via ORAL
  Filled 2018-08-07: qty 1

## 2018-08-07 MED ORDER — PANTOPRAZOLE SODIUM 40 MG PO TBEC: 40.0000 mg | DELAYED_RELEASE_TABLET | Freq: Every day | ORAL | Status: DC

## 2018-08-07 MED ORDER — CIPROFLOXACIN HCL 500 MG PO TABS: 500.0000 mg | ORAL_TABLET | Freq: Two times a day (BID) | ORAL | 0 refills | Status: AC

## 2018-08-07 MED ORDER — SIMETHICONE 40 MG/0.6ML PO SUSP: 80.0000 mg | Freq: Three times a day (TID) | ORAL | 0 refills | Status: DC | PRN

## 2018-08-07 MED ORDER — METRONIDAZOLE 500 MG PO TABS: 500.0000 mg | ORAL_TABLET | Freq: Three times a day (TID) | ORAL | 0 refills | Status: AC

## 2018-08-07 MED ORDER — METRONIDAZOLE 500 MG PO TABS
500.0000 mg | ORAL_TABLET | Freq: Three times a day (TID) | ORAL | Status: DC
  Administered 2018-08-07: 07:00:00 500 mg via ORAL
  Filled 2018-08-07: qty 1

## 2018-08-07 MED ORDER — CIPROFLOXACIN HCL 500 MG PO TABS: 500.0000 mg | ORAL_TABLET | Freq: Two times a day (BID) | ORAL | 0 refills | Status: DC

## 2018-08-07 NOTE — Progress Notes (Addendum)
Progress Note: General Surgery Service   Assessment/Plan: Principal Problem:   Acute diverticulitis Active Problems:   Sepsis (HCC)   Asthma   Depression  s/p   Bipolar disorder- per medicine  Perforated descending diverticulitis with 2 intra-abdominal abscesses -S/PIR drain 05/18, cxs pending, gram variable rods currently -repeat CT 05/21 showed improved small bowel distention otherwise relatively unchanged.  - labs normalized, pain resolved -ok to discharge home with follow with [facility]CS and radiology and oral abx for 7 additional days  FEN -soft diet  VTE -may have chemical prophylaxis from our standpoint ID -[email or web address] 5/15>>WBC down to 11.0 today POC - Raynelle Highland, wife, [phone] Foley: none Follow up:TBD  DISPO:ok to discharge home with follow with [facility] and radiology and oral abx for 7 additional days   LOS: 10 days  Chief Complaint/Subjective: Tolerating soft diet, no pain this morning  Objective: Vital signs in last 24 hours: Temp:  [98.2 F (36.8 C)-98.5 F (36.9 C)] 98.3 F (36.8 C) (05/23 0453) Pulse Rate:  [67] 67 (05/22 1152) Resp:  [16-20] 20 (05/23 0453) BP: (98-115)/(73-80) 98/76 (05/23 0453) SpO2:  [96 %-99 %] 99 % (05/23 0453) Weight:  [71.9 kg] 71.9 kg (05/23 0453) Last BM Date: 08/05/18  Intake/Output from previous day: 05/22 0701 - 05/23 0700 In: 1984.3 [P.O.:1400; I.V.:14.8; IV Piggyback:544.5] Out: 30 [Drains:30] Intake/Output this shift: Total I/O In: 240 [P.O.:240] Out: -   Lungs: nonlabored  Cardiovascular: RRR  Abd: soft, Nt, ND drain in place with smaml amount drainage  Extremities: no edema  Neuro: AOx4  Lab Results: CBC  Recent Labs    08/06/18 0421 08/07/18 0436  WBC 11.0* 8.5  HGB 13.9 13.4  HCT 40.3 40.2  PLT 515* 529*   BMET Recent Labs    08/06/18 0421 08/07/18 0436  NA 139 137  K 3.6 3.6  CL 103 103  CO2 24 23  GLUCOSE 99 103*  BUN 6 11  CREATININE 0.80 0.82  CALCIUM  9.1 9.0   PT/INR No results for input(s): LABPROT, INR in the last 72 hours. ABG No results for input(s): PHART, HCO3 in the last 72 hours.  Invalid input(s): PCO2, PO2  Studies/Results:  Anti-infectives: Anti-infectives (From admission, onward)   Start     Dose/Rate Route Frequency Ordered Stop   08/07/18 0800  ciprofloxacin (CIPRO) tablet 500 mg     500 mg Oral 2 times daily 08/07/18 0611     08/07/18 0615  metroNIDAZOLE (FLAGYL) tablet 500 mg     500 mg Oral Every 8 hours 08/07/18 0611     08/05/18 1800  fluconazole (DIFLUCAN) IVPB 400 mg     400 mg 100 mL/hr over 120 Minutes Intravenous Every 24 hours 08/05/18 1709     07/30/18 1400  piperacillin-tazobactam (ZOSYN) IVPB 3.375 g  Status:  Discontinued     3.375 g 12.5 mL/hr over 240 Minutes Intravenous Every 8 hours 07/30/18 1322 08/07/18 0611   07/30/18 1000  amoxicillin-clavulanate (AUGMENTIN) 875-125 MG per tablet 1 tablet  Status:  Discontinued     1 tablet Oral Every 12 hours 07/30/18 0820 07/30/18 1312   07/30/18 0000  amoxicillin-clavulanate (AUGMENTIN) 875-125 MG tablet     1 tablet Oral Every 12 hours 07/30/18 0913     07/29/18 2000  piperacillin-tazobactam (ZOSYN) IVPB 3.375 g  Status:  Discontinued     3.375 g 12.5 mL/hr over 240 Minutes Intravenous Every 8 hours 07/29/18 1328 07/30/18 0820   07/29/18 0700  ciprofloxacin (CIPRO) IVPB 400 mg  Status:  Discontinued     400 mg 200 mL/hr over 60 Minutes Intravenous Every 12 hours 07/28/18 2215 07/29/18 1327   07/29/18 0400  metroNIDAZOLE (FLAGYL) IVPB 500 mg  Status:  Discontinued     500 mg 100 mL/hr over 60 Minutes Intravenous Every 8 hours 07/28/18 2146 07/29/18 1328   07/28/18 1845  ciprofloxacin (CIPRO) IVPB 400 mg     400 mg 200 mL/hr over 60 Minutes Intravenous  Once 07/28/18 1835 07/28/18 2032   07/28/18 1845  metroNIDAZOLE (FLAGYL) IVPB 500 mg     500 mg 100 mL/hr over 60 Minutes Intravenous  Once 07/28/18 1835 07/28/18 2032      Medications:  Scheduled Meds: . ciprofloxacin  500 mg Oral BID  . feeding supplement (ENSURE ENLIVE)  237 mL Oral BID BM  . metoCLOPramide (REGLAN) injection  10 mg Intravenous Q8H  . metroNIDAZOLE  500 mg Oral Q8H  . multivitamin with minerals  1 tablet Oral Daily  . pantoprazole (PROTONIX) IV  40 mg Intravenous Q24H  . sodium chloride flush  5 mL Intracatheter Q8H   Continuous Infusions: . sodium chloride Stopped (08/06/18 1815)  . fluconazole (DIFLUCAN) IV 100 mL/hr at 08/06/18 1900   PRN Meds:.sodium chloride, acetaminophen **OR** acetaminophen, albuterol, morphine injection, ondansetron (ZOFRAN) IV, oxyCODONE, simethicone  Rodman PickleLuke Aaron Malikah Principato, MD [facility]

## 2018-08-07 NOTE — Care Management (Signed)
Pt given MATCH letter to cover prescriptions at $3/each.  Patient states he can pay $15 for all five and will fill today.  Override entered into [facility] system for narcotic.

## 2018-08-07 NOTE — Progress Notes (Signed)
PT Cancellation Note  Patient Details Name: Jay Davis MRN: [id number] DOB: 09-15-1980   Cancelled Treatment:    Reason Eval/Treat Not Completed: PT screened, no needs identified, will sign off(pt standing in hall fully dressed, reports complete independence and awaiting D/C. Pt denied any need for therapy at this time. Will sign off. )   [name] B [name] 08/07/2018, 10:06 AM  [name], PT Acute Rehabilitation Services Pager: [phone] Office: [phone]

## 2018-08-07 NOTE — Discharge Summary (Addendum)
Discharge Summary  Slate Debroux ZOX:[id number] DOB: 1981/01/11  PCP: Patient, No Pcp Per  Admit date: 07/28/2018 Discharge date: 08/07/2018  Time spent: 35 minutes  Recommendations for Outpatient Follow-up:  1. Follow-up with your primary care provider 2. Follow-up with general surgery 3. Follow-up with interventional radiology 4. Take your medications as prescribed 5. Wound care with home health services  Discharge Diagnoses:  Active Hospital Problems   Diagnosis Date Noted   Acute diverticulitis 07/28/2018   Sepsis (HCC) 07/28/2018   Asthma 07/28/2018   Depression 07/28/2018    Resolved Hospital Problems  No resolved problems to display.    Discharge Condition: Stable  Diet recommendation: Soft diet with thin liquid  Vitals:   08/06/18 1152 08/07/18 0453  BP: 101/73 98/76  Pulse: 67   Resp: 20 20  Temp: 98.5 F (36.9 C) 98.3 F (36.8 C)  SpO2: 99% 99%    History of present illness:   38 year old male with history of asthma, depression, bipolar disorder, former smoker, marijuana abuse presenting with abdominal pain and found to haveacute diverticulitiswithout abscess or perforation.  In ED, febrile to 102.7 and tachycardic. Otherwise, hemodynamically stable. CBC with mild leukocytosis to 14. Lactic acid, lipase, urinalysis and CMP not impressive. COVID-19 negative. CT abdomen and pelvis revealed acute diverticulitis of left colon with surrounding edema but no abscess or perforation.  Admitted for acute diverticulitis on IV Flagyl and Cipro. Transitioned to IV Zosyn.  On 5/15, pain improved. Diet advanced. Later in the day, patient started having severe LLQ pain. He was restarted on IV fluid, clear liquid diet and IV Zosyn. KUB did not reveal significant finding other than gas throughout his bowel. Patient continued to endorse significant abdominal pain. On 5/17, he had frequent diarrhea and emesis. Repeat CT abdomen and pelvis was obtained and  showed perforated descending colon diverticulitis with 2 intra-abdominal abscesses and partial SBO.General surgery was consulted and discussed the case with IR. The consensus was to proceed with drain placement by IR. Patient had CT-guided drain placement on 08/02/2018.  Continued to improve clinically however repeat CT abdomen and pelvis done on 08/05/2018 showed multiple poorly defined collection. Tolerating a diet well.  No worsening abdominal pain.  No diarrhea or constipation.  08/07/18: Patient seen and examined at his bedside.  No acute events overnight.  Vital signs and labs reviewed and are stable.  He denies any pain.  Tolerating a soft diet well.  General surgery saw him this morning and signed off.  Will follow up outpatient.  On the day of discharge, the patient was hemodynamically stable.  He will need to follow-up with general surgery, interventional radiology, his primary care provider posthospitalization.  He will also need to take his medications as prescribed.  Home health services for drain care.  Hospital Course:  Principal Problem:   Acute diverticulitis Active Problems:   Sepsis (HCC)   Asthma   Depression  Perforated descending diverticulitis with 2 intra-abdominal abscesses status post CT guided drain placement on 08/01/2016 by interventional radiology. Repeat CT done on 08/05/2018 showed multiple poorly defined collections Patient denies abdominal pain or nausea Tolerating his diet well which is been advanced to soft diet per general surgery General surgery and IR followed Completed 10 days of IV Zosyn Blood cultures x2- to date Abscess culture from 08/02/18 showed gram variable rods and rare of Pseudomonas and yeast Completed 6 days of IV fluconazole Afebrile with no leukocytosis Vital signs reviewed and are stable Follow-up with interventional radiology Follow-up with general surgery  IV Zosyn switched to oral ciprofloxacin and oral Flagyl x7 days Duration of  antibiotics as recommended by general surgery.  Chronic depression/bipolar disorder Stable Not on medications  Bilateral pulmonary nodules measuring up to 7 mm Incidental finding on CT abdomen Recommend noncontrast chest CT in 3 to 6 months If stable at that time repeat CT in 18 to 24 months Abstain from tobacco use  History of asthma Stable  GERD Continue Protonix   Code Status:full    Consultants:  GI  IR  Procedures: 5/18 CT guided placement of drainage catheter within lower abdominal abscess    Discharge Exam: BP 98/76 (BP Location: Left Arm)    Pulse 67    Temp 98.3 F (36.8 C) (Oral)    Resp 20    Ht  (1.626 m)    Wt 71.9 kg    SpO2 99%    BMI 27.22 kg/m   General: 38 y.o. year-old male well developed well nourished in no acute distress.  Alert and oriented x3.  Cardiovascular: Regular rate and rhythm with no rubs or gallops.  No thyromegaly or JVD noted.    Respiratory: Clear to auscultation with no wheezes or rales. Good inspiratory effort.  Abdomen: Soft nontender nondistended with normal bowel sounds x4 quadrants.  Left upper quadrant drain with small amount of light yellow fluid.  Musculoskeletal: No lower extremity edema. 2/4 pulses in all 4 extremities.  Psychiatry: Mood is appropriate for condition and setting  Discharge Instructions You were cared for by a hospitalist during your hospital stay. If you have any questions about your discharge medications or the care you received while you were in the hospital after you are discharged, you can call the unit and asked to speak with the hospitalist on call if the hospitalist that took care of you is not available. Once you are discharged, your primary care physician will handle any further medical issues. Please note that NO REFILLS for any discharge medications will be authorized once you are discharged, as it is imperative that you return to your primary care physician (or establish a  relationship with a primary care physician if you do not have one) for your aftercare needs so that they can reassess your need for medications and monitor your lab values.  Discharge Instructions    Call MD for:  difficulty breathing, headache or visual disturbances   Complete by:  As directed    Call MD for:  persistant dizziness or light-headedness   Complete by:  As directed    Call MD for:  persistant nausea and vomiting   Complete by:  As directed    Call MD for:  severe uncontrolled pain   Complete by:  As directed    Call MD for:  temperature >100.4   Complete by:  As directed    Diet general   Complete by:  As directed    Soft diet   Discharge instructions   Complete by:  As directed    It has been a pleasure taking care of you! You were admitted with diverticulitis.  You were treated with antibiotic.  With that your symptoms improved to the point we think it is safe to let you go home and finish your antibiotic course.  Follow-up with your primary care doctor in 1 to 2 weeks.  Once you are discharged, your primary care physician will handle any further medical issues. Please note that NO REFILLS for any discharge medications will be authorized once you are  discharged, as it is imperative that you return to your primary care physician (or establish a relationship with a primary care physician if you do not have one) for your aftercare needs so that they can reassess your need for medications and monitor your lab values. Take care,   Increase activity slowly   Complete by:  As directed      Allergies as of 08/07/2018      Reactions   Ambien [zolpidem Tartrate] Other (See Comments)   States he turns into a zombie   Pork-derived Products       Medication List    STOP taking these medications   Claritin-D 24 Hour 10-240 MG 24 hr tablet Generic drug:  loratadine-pseudoephedrine   ibuprofen 400 MG tablet Commonly known as:  ADVIL   naproxen 500 MG tablet Commonly known  as:  NAPROSYN     TAKE these medications   acetaminophen 325 MG tablet Commonly known as:  TYLENOL Take 650 mg by mouth every 6 (six) hours as needed.   albuterol 108 (90 Base) MCG/ACT inhaler Commonly known as:  VENTOLIN HFA Inhale 1-2 puffs into the lungs every 6 (six) hours as needed for wheezing.   ciprofloxacin 500 MG tablet Commonly known as:  CIPRO Take 1 tablet (500 mg total) by mouth 2 (two) times daily for 7 days.   loratadine 10 MG tablet Commonly known as:  Claritin Take 1 tablet (10 mg total) by mouth daily.   metroNIDAZOLE 500 MG tablet Commonly known as:  FLAGYL Take 1 tablet (500 mg total) by mouth every 8 (eight) hours for 7 days.   multivitamin with minerals Tabs tablet Take 1 tablet by mouth daily. Start taking on:  Aug 08, 2018   oxyCODONE 5 MG immediate release tablet Commonly known as:  Oxy IR/ROXICODONE Take 1 tablet (5 mg total) by mouth 2 (two) times daily as needed for severe pain.   pantoprazole 40 MG tablet Commonly known as:  PROTONIX Take 1 tablet (40 mg total) by mouth daily.   simethicone 40 MG/0.6ML drops Commonly known as:  MYLICON Take 1.2 mLs (80 mg total) by mouth every 8 (eight) hours as needed for flatulence.      Allergies  Allergen Reactions   Ambien [Zolpidem Tartrate] Other (See Comments)    States he turns into a zombie    Pork-Derived Products    Follow-up Information    [facility]. Go on 08/05/2018.   Why:  1:50pm Contact information: [address] [place] [address] [phone]       Richarda Overlie, MD Follow up in 2 week(s).   Specialties:  Interventional Radiology, Radiology Why:  follow up drain in 2 weeks with IR OP Clinic; we will call pt with time and date; call [phone] if any questions or concerns Contact information: [address] [place] [address] [phone]        Andria Meuse, MD Follow up in 1 month(s).     Specialty:  General Surgery Contact information: [address] [place] [address] [phone]            The results of significant diagnostics from this hospitalization (including imaging, microbiology, ancillary and laboratory) are listed below for reference.    Significant Diagnostic Studies: Ct Abdomen Pelvis W Contrast  Result Date: 08/05/2018 CLINICAL DATA:  Diverticulitis. Status post percutaneous drain placement. EXAM: CT ABDOMEN AND PELVIS WITH CONTRAST TECHNIQUE: Multidetector CT imaging of the abdomen  and pelvis was performed using the standard protocol following bolus administration of intravenous contrast. CONTRAST:  OMNIPAQUE IOHEXOL 300 MG/ML  SOLN COMPARISON:  08/02/2018 FINDINGS: Lower chest: 7 mm nodule again noted peripheral left lower lobe. 4 mm subpleural left lower lobe nodule visible on 12/05. 4 mm right lower lobe nodule visible on 14/5. Hepatobiliary: No suspicious focal abnormality within the liver parenchyma. There is no evidence for gallstones, gallbladder wall thickening, or pericholecystic fluid. No intrahepatic or extrahepatic biliary dilation. Pancreas: No focal mass lesion. No dilatation of the main duct. No intraparenchymal cyst. No peripancreatic edema. Spleen: No splenomegaly. No focal mass lesion. Adrenals/Urinary Tract: No adrenal nodule or mass. Early excretion of contrast noted in both kidneys with a tiny nonobstructing stone evident in the lower pole right kidney. No evidence for hydroureter. Contrast material noted distal right ureter and in the bladder lumen adjacent to the UVJ. Stomach/Bowel: Stomach is unremarkable. No gastric wall thickening. No evidence of outlet obstruction. Duodenum is normally positioned as is the ligament of Treitz. No small bowel obstruction. The terminal ileum is normal. The appendix is normal. Diverticular changes noted left colon with colonic wall thickening. There is extensive pericolonic edema/inflammation and  adjacent small bowel loops show wall thickening, likely reactive. Vascular/Lymphatic: No abdominal aortic aneurysm. There is no gastrohepatic or hepatoduodenal ligament lymphadenopathy. No intraperitoneal or retroperitoneal lymphadenopathy. No pelvic sidewall lymphadenopathy. Reproductive: The prostate gland and seminal vesicles are unremarkable. Other: Trace free intraperitoneal fluid. Extensive edema/inflammation noted in the left lower quadrant. Left lower quadrant percutaneous drainage catheter is stable with persistent multifocal abscess pockets evident. 1.4 x 2.0 x 6.2 cm irregular rim enhancing collection of gas and fluid is seen tracking up the left abdomen anterior to the descending colon. 2.1 x 4.9 x 2.1 cm rim enhancing collection of fluid and gas is identified just deep to the left rectus muscle. Other scattered small collections of gas and fluid are seen in the left lower quadrant. Musculoskeletal: No worrisome lytic or sclerotic osseous abnormality. IMPRESSION: 1. No substantial change in the multifocal/multiloculated left lower quadrant abscess with stable position of the percutaneous drainage catheter since 08/02/2018. 2. Small-bowel distention seen on 08/01/2018 has resolved in the interval. There are small bowel loops in the left abdomen the demonstrate wall thickening, likely secondary to the adjacent inflammatory change. 3. Bilateral pulmonary nodules measuring up to 7 mm. Non-contrast chest CT at 3-6 months is recommended. If the nodules are stable at time of repeat CT, then future CT at 18-24 months (from today's scan) is considered optional for low-risk patients, but is recommended for high-risk patients. This recommendation follows the consensus statement: Guidelines for Management of Incidental Pulmonary Nodules Detected on CT Images: From the Fleischner Society 2017; Radiology 2017; 284:228-243. Electronically Signed   By: Kennith Center M.D.   On: 08/05/2018 13:28   Ct Abdomen Pelvis W  Contrast  Result Date: 08/01/2018 CLINICAL DATA:  Follow-up diverticulitis. Persistent generalized abdominal pain, nausea and vomiting and fevers. EXAM: CT ABDOMEN AND PELVIS WITH CONTRAST TECHNIQUE: Multidetector CT imaging of the abdomen and pelvis was performed using the standard protocol following bolus administration of intravenous contrast. CONTRAST:  OMNIPAQUE IOHEXOL 300 MG/ML IV. COMPARISON:  07/28/2018. FINDINGS: Lower chest: Minimal linear atelectasis in the RIGHT LOWER LOBE. Vague approximate 5 mm nodule in the posterolateral RIGHT LOWER LOBE (series 4, image 6). Pleural based approximate 7 x 5 mm (6 mm mean) nodule involving the posterolateral LEFT LOWER LOBE. Normal heart size. Hepatobiliary: Liver normal in size  and appearance. Gallbladder normal in appearance without calcified gallstones. No biliary ductal dilation. Pancreas: Normal in appearance without evidence of mass, ductal dilation, or inflammation. Spleen: Normal in size and appearance. Adrenals/Urinary Tract: Normal appearing adrenal glands. Contrast material in the renal collecting system obscures the previously identified RIGHT LOWER pole renal calculi. No focal parenchymal abnormality involving either kidney. No ureteral calculi. Urinary bladder decompressed and unremarkable. Stomach/Bowel: Stomach normal in appearance for the degree of distention. Interval development of marked dilation of multiple loops of small bowel throughout the abdomen and UPPER pelvis with a transition point in the LOWER pelvis, likely at the level of the proximal ileum. The proximal ileal loops are thick-walled with mucosal enhancement, and there is extensive inflammation in the adjacent fat, indicating secondary inflammation from the perforated diverticulitis detailed below. Since the examination 4 days ago, the patient has developed free intraperitoneal air and a small abscess adjacent to the descending colon at the site of diverticulitis, the abscess  measuring approximately 4.4 x 2.6 x 3.5 cm. A second abscess is present in the LEFT UPPER pelvis anteriorly adjacent to an inflamed small bowel loop, measuring approximately 3.0 x 4.5 x 3.4 cm. Phlegmonous changes with extraluminal gas are present in the UPPER and mid LEFT pelvis anteriorly adjacent to the descending colon and proximal sigmoid colon. Vascular/Lymphatic: No visible aortoiliofemoral atherosclerosis. Widely patent visceral arteries. Normal-appearing portal venous and systemic venous systems. No pathologic lymphadenopathy. Reproductive: Prostate gland and seminal vesicles normal in size and appearance for age. Other: None. 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 No acute findings. IMPRESSION: 1. Perforated descending diverticulitis with pneumoperitoneum and at least 2 abscesses in the LEFT side of the pelvis, measured above. 2. Secondary inflammation of the small bowel which is likely the proximal ileum, accounting for a developing partial small bowel obstruction. 3. Nodules involving both lower lobes, measuring approximately 5 mm on the RIGHT and approximately 6 mm on the LEFT. Non-contrast chest CT at 3-6 months is recommended. If the nodules are stable at time of repeat CT, then future CT at 18-24 months (from today's scan) is considered optional for low-risk patients, but is recommended for high-risk patients. This recommendation follows the consensus statement: Guidelines for Management of Incidental Pulmonary Nodules Detected on CT Images: From the Fleischner Society 2017; Radiology 2017; 284:228-243. I telephoned these critical/emergent results to Dr. Alanda SlimGonfa of the hospitalist service at the time of interpretation on 08/01/2018 at 3 o'clock p.m. Electronically Signed   By: Hulan Saashomas  Lawrence M.D.   On: 08/01/2018 15:02   Ct Abdomen Pelvis W Contrast  Result Date: 07/28/2018 CLINICAL DATA:  Left lower quadrant pain EXAM: CT ABDOMEN AND PELVIS WITH  CONTRAST TECHNIQUE: Multidetector CT imaging of the abdomen and pelvis was performed using the standard protocol following bolus administration of intravenous contrast. CONTRAST:  100mL OMNIPAQUE IOHEXOL 300 MG/ML  SOLN COMPARISON:  None. FINDINGS: Lower chest: Lung bases clear bilaterally. Hepatobiliary: No focal liver abnormality is seen. No gallstones, gallbladder wall thickening, or biliary dilatation. Pancreas: Negative Spleen: Negative Adrenals/Urinary Tract: Small nonobstructing calculi right lower pole. No renal obstruction or mass. Normal bladder. Stomach/Bowel: Segmental thickening of the lower left colon with extensive edema in the surrounding mesentery. Diverticula noted in the left colon and sigmoid colon. Findings compatible with diverticulitis without abscess or perforation. Normal appendix. Distended fluid-filled small bowel loops are present in the abdomen most likely ileus. Stool in the colon without dilatation. Vascular/Lymphatic: No significant vascular findings are present. No enlarged abdominal or pelvic lymph  nodes. Reproductive: Mild prostate enlargement Other: Negative for hernia Musculoskeletal: Negative for acute abnormality. 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 IMPRESSION: 1. Acute diverticulitis of the left colon with surrounding edema but no abscess or perforation. 2. Nonobstructing small right lower pole renal calculi 3. 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 Electronically Signed   By: Marlan Palau M.D.   On: 07/28/2018 18:26   Dg Abd Portable 1v  Result Date: 07/30/2018 CLINICAL DATA:  Left lower quadrant pain for several days. Diagnosis of diverticulitis 07/28/2018. EXAM: PORTABLE ABDOMEN - 1 VIEW COMPARISON:  None. FINDINGS: The bowel gas pattern is normal. No radio-opaque calculi or other significant radiographic abnormality are seen. IMPRESSION: Negative exam. Electronically Signed   By: Drusilla Kanner M.D.   On: 07/30/2018  16:03   Ct Image Guided Drainage By Percutaneous Catheter  Result Date: 08/02/2018 INDICATION: 38 year old with diverticulitis and free air. Patient has multiple small gas and air-fluid collections. EXAM: CT GUIDED DRAINAGE OF INTRA-ABDOMINAL ABSCESS MEDICATIONS: The patient is currently admitted to the hospital and receiving intravenous antibiotics. ANESTHESIA/SEDATION: 2.0 mg IV Versed 100 mcg IV Fentanyl Moderate Sedation Time:  27 minutes The patient was continuously monitored during the procedure by the interventional radiology nurse under my direct supervision. COMPLICATIONS: None immediate. TECHNIQUE: Informed written consent was obtained from the patient after a thorough discussion of the procedural risks, benefits and alternatives. All questions were addressed. Maximal Sterile Barrier Technique was utilized including caps, mask, sterile gowns, sterile gloves, sterile drape, hand hygiene and skin antiseptic. A timeout was performed prior to the initiation of the procedure. PROCEDURE: Patient was placed supine on CT scanner and images through the lower abdomen were obtained. Largest air-fluid collection in the anterior lower abdomen was targeted for drainage. The left lower abdomen was prepped and draped in a sterile fashion. Skin was anesthetized with 1% lidocaine. Using CT guidance, 18 gauge trocar needle was directed into the air-fluid collection. Initially, only 1 mL of yellow serous looking fluid could be aspirated. Amplatz wire was advanced into the poorly defined collection. Tract was dilated to accommodate a 10.2 [place] drain. The drain was slightly pulled back and repositioned using CT guidance. Approximately 10 mL of thick yellow purulent fluid was eventually removed. Small amount of gas was also aspirated. The catheter was sutured to skin and attached to a suction bulb. FINDINGS: Topogram again demonstrates dilated loops of small bowel in the mid abdomen. CT images demonstrate extensive  inflammatory changes in the lower abdomen. There are multiple small gas collections in the lower abdomen including one small collection anterior to the distal descending colon. The largest collection was felt to be at the lower abdominal midline. Needle and drain were placed within this irregular midline collection. Approximately 10 mL yellow purulent fluid was removed. No significant fluid associated with the other gas collections. In particular, the collection just anterior to the descending colon probably represents gas and phlegmon. IMPRESSION: CT-guided placement of a drainage catheter within a lower abdominal abscess. Multiple small gas pockets throughout the lower abdomen and near the descending colon. No additional drains were placed at this time due to the small size of these collections. However, patient will need follow-up CT imaging to see if these additional gas collections organize into discrete abscess collections. Electronically Signed   By: Richarda Overlie M.D.   On: 08/02/2018 16:54    Microbiology: Recent Results (from the past 240 hour(s))  Blood culture (routine x 2)  Status: None   Collection Time: 07/28/18  7:00 PM  Result Value Ref Range Status   Specimen Description   Final    BLOOD RIGHT ANTECUBITAL Performed at [facility], [address] [place], [place] [address]    Special Requests   Final    BOTTLES DRAWN AEROBIC AND ANAEROBIC Blood Culture adequate volume Performed at [facility], [address] [place], [place] [address]    Culture   Final    NO GROWTH 5 DAYS Performed at [facility], [address] [place], [place] [address]    Report Status 08/02/2018 FINAL  Final  Blood culture (routine x 2)     Status: None   Collection Time: 07/28/18  7:05 PM  Result Value Ref Range Status   Specimen Description   Final    BLOOD RIGHT FOREARM Performed at [facility], [address], [place], [place] [address]    Special Requests    Final    BOTTLES DRAWN AEROBIC AND ANAEROBIC Blood Culture adequate volume Performed at [facility], [address] [place], [place] [address]    Culture   Final    NO GROWTH 5 DAYS Performed at [facility], [address], [place], [place] [address]    Report Status 08/02/2018 FINAL  Final  SARS Coronavirus 2 (Hosp order,Performed in [facility] lab via [employer] [facility])     Status: None   Collection Time: 07/28/18  7:10 PM  Result Value Ref Range Status   SARS Coronavirus 2 ([employer] Now) NEGATIVE NEGATIVE Final    Comment: (NOTE) Interpretive Result Comment(s): COVID 19 Positive SARS CoV 2 target nucleic acids are DETECTED. The SARS CoV 2 RNA is generally detectable in upper and lower respiratory specimens during the acute phase of infection.  Positive results are indicative of active infection with SARS CoV 2.  Clinical correlation with patient history and other diagnostic information is necessary to determine patient infection status.  Positive results do not rule out bacterial infection or coinfection with other viruses. The expected result is Negative. COVID 19 Negative SARS CoV 2 target nucleic acids are NOT DETECTED. The SARS CoV 2 RNA is generally detectable in upper and lower respiratory specimens during the acute phase of infection.  Negative results do not preclude SARS CoV 2 infection, do not rule out coinfections with other pathogens, and should not be used as the sole basis for treatment or other patient management decisions.  Negative results must be combined with clinical  observations, patient history, and epidemiological information. The expected result is Negative. Invalid Presence or absence of SARS CoV 2 nucleic acids cannot be determined. Repeat testing was performed on the submitted specimen and repeated Invalid results were obtained.  If clinically indicated, additional testing on a new specimen with an alternate test  methodology [phone]) is advised.  The SARS CoV 2 RNA is generally detectable in upper and lower respiratory specimens during the acute phase of infection. The expected result is Negative. Fact Sheet for Patients:  http://www.graves-ford.org/ Fact Sheet for Healthcare Providers: EnviroConcern.si This test is not yet approved or cleared by the [place] FDA and has been authorized for detection and/or diagnosis of SARS CoV 2 by FDA under an Emergency Use Authorization (EUA).  This EUA will remain in effect (meaning this test can be used) for the duration of the COVID19 d eclaration under Section 564(b)(1) of the Act, 21 U.S.C. section  360bbb 3(b)(1), unless the authorization is terminated or revoked sooner. Performed at [facility], [address] [place], [place] [address]   Aerobic/Anaerobic Culture (surgical/deep wound)     Status: None   Collection Time: 08/02/18  2:43 PM  Result Value Ref Range Status   Specimen Description ABSCESS  Final   Special Requests DRAINAGE LOWER ABDOMEN  Final   Gram Stain   Final    ABUNDANT WBC PRESENT, PREDOMINANTLY PMN RARE GRAM VARIABLE ROD    Culture   Final    RARE PSEUDOMONAS AERUGINOSA RARE CANDIDA ALBICANS NO ANAEROBES ISOLATED Performed at [facility], [address], [place], [place] [address]    Report Status 08/07/2018 FINAL  Final   Organism ID, Bacteria PSEUDOMONAS AERUGINOSA  Final      Susceptibility   Pseudomonas aeruginosa - MIC*    CEFTAZIDIME <=1 SENSITIVE Sensitive     CIPROFLOXACIN <=0.25 SENSITIVE Sensitive     GENTAMICIN <=1 SENSITIVE Sensitive     IMIPENEM 1 SENSITIVE Sensitive     PIP/TAZO <=4 SENSITIVE Sensitive     CEFEPIME <=1 SENSITIVE Sensitive     * RARE PSEUDOMONAS AERUGINOSA     Labs: Basic Metabolic Panel: Recent Labs  Lab 08/02/18 0735 08/03/18 0524 08/04/18 0458 08/05/18 0353 08/06/18 0421 08/07/18 0436  NA 137 137 138 137 139  137  K 3.7 3.5 3.4* 3.7 3.6 3.6  CL 104 99 103 103 103 103  CO2 23 22 23 25 24 23   GLUCOSE 108* 110* 110* 104* 99 103*  BUN 11 9 9 7 6 11   CREATININE 0.77 0.81 0.78 0.80 0.80 0.82  CALCIUM 8.5* 8.7* 8.7* 8.8* 9.1 9.0  MG 2.0 2.1 2.2 2.2 2.3  --    Liver Function Tests: Recent Labs  Lab 08/01/18 0625  AST 16  ALT 19  ALKPHOS 53  BILITOT 0.8  PROT 7.0  ALBUMIN 2.6*   No results for input(s): LIPASE, AMYLASE in the last 168 hours. No results for input(s): AMMONIA in the last 168 hours. CBC: Recent Labs  Lab 08/03/18 0524 08/04/18 0458 08/05/18 0353 08/06/18 0421 08/07/18 0436  WBC 11.4* 10.5 12.2* 11.0* 8.5  HGB 13.7 13.1 12.8* 13.9 13.4  HCT 40.3 38.4* 38.2* 40.3 40.2  MCV 86.3 86.7 87.0 85.2 87.0  PLT 405* 436* 486* 515* 529*   Cardiac Enzymes: No results for input(s): CKTOTAL, CKMB, CKMBINDEX, TROPONINI in the last 168 hours. BNP: BNP (last 3 results) No results for input(s): BNP in the last 8760 hours.  ProBNP (last 3 results) No results for input(s): PROBNP in the last 8760 hours.  CBG: No results for input(s): GLUCAP in the last 168 hours.     Signed:  Darlin Drop, MD [facility] 08/07/2018, 11:56 AM

## 2018-08-07 NOTE — Progress Notes (Signed)
Patient discharged from unit to pvt auto to home with family providing transportation. All personal belongings with patient. Patient voices no c/o discomfort or distress at this time.  All d/c instructions reviewed with patient. Emphasis placed on making follow up appointments this week. Patient agreeable. Patient declined home health care and states that he and his wife can empty bulb syringe daily as needed and record amounts.   Patient did not have [facility]s [facility], MD alerted to phone all rx to [facility] on [address].Marland Kitchen

## 2018-08-10 ENCOUNTER — Other Ambulatory Visit: Payer: Self-pay | Admitting: Surgery

## 2018-08-10 DIAGNOSIS — L0291 Cutaneous abscess, unspecified: Secondary | ICD-10-CM

## 2018-08-17 ENCOUNTER — Ambulatory Visit
Admission: RE | Admit: 2018-08-17 | Discharge: 2018-08-17 | Disposition: A | Discharge status: Home / Self Care | Payer: Self-pay | Source: Ambulatory Visit | Attending: Surgery | Admitting: Surgery

## 2018-08-17 ENCOUNTER — Encounter: Payer: Self-pay | Admitting: Radiology

## 2018-08-17 ENCOUNTER — Ambulatory Visit
Admission: RE | Admit: 2018-08-17 | Discharge: 2018-08-17 | Disposition: A | Discharge status: Home / Self Care | Payer: Self-pay | Source: Ambulatory Visit | Attending: Radiology | Admitting: Radiology

## 2018-08-17 DIAGNOSIS — L0291 Cutaneous abscess, unspecified: Secondary | ICD-10-CM

## 2018-08-17 HISTORY — PX: IR RADIOLOGIST EVAL & MGMT: IMG5224

## 2018-08-17 MED ORDER — IOPAMIDOL (ISOVUE-300) INJECTION 61%
100.0000 mL | Freq: Once | INTRAVENOUS | Status: AC | PRN
  Administered 2018-08-17: 14:00:00 100 mL via INTRAVENOUS

## 2018-08-17 NOTE — Progress Notes (Signed)
Referring Physician(s): Dr. Marin Olphristopher White  Chief Complaint: The patient is seen in follow up today s/p acute diverticulitis with perforation requiring percutaneous drain placement 5/18  History of present illness: Jay Davis is a 38 year old male with history of bipolar depression who [facility] ED 07/28/18 with abdominal pain and fever.  He was found to have uncomplicated diverticulitis of the descending colon and was treated conservatively with antibiotics.  He continued with pain and developed nausea and vomiting on 5/17.  A CT Abdomen/Pelvis performed showed perforated descending diverticulitis with pneumoperitoneum as well as partial small bowel obstruction. He underwent percutaneous drain placement 08/02/18 with one drain placed in the LLQ.  He continued antibiotics and his condition eventually improved. He was discharged home with outpatient follow-up and antibiotics.  Drain was left in place.  He presents to IR drain clinic today for follow-up of his drain.   Patient reports he is overall improved since discharge.  He states he has no abdominal pain, nausea, vomiting, fevers, or difficulty with bowel movements.  He has not been flushing his drain, but has been emptying a trace amount of "reddish" output from bulb daily. He reports he is currently on antibiotics and believes he has a few more days of this regimen.  He does not know when his follow-up with surgery is scheduled.   Past Medical History:  Diagnosis Date  . Bipolar 1 disorder (HCC)   . Depression     Past Surgical History:  Procedure Laterality Date  . IR RADIOLOGIST EVAL & MGMT  08/17/2018    Allergies: Ambien [zolpidem tartrate] and Pork-derived products  Medications: Prior to Admission medications   Medication Sig Start Date End Date Taking? Authorizing Provider  acetaminophen (TYLENOL) 325 MG tablet Take 650 mg by mouth every 6 (six) hours as needed.    [provider]  albuterol (PROVENTIL  HFA;VENTOLIN HFA) 108 (90 BASE) MCG/ACT inhaler Inhale 1-2 puffs into the lungs every 6 (six) hours as needed for wheezing. 01/04/13   Palumbo, April, MD  loratadine (CLARITIN) 10 MG tablet Take 1 tablet (10 mg total) by mouth daily. Patient not taking: Reported on 07/29/2018 01/04/13   Palumbo, April, MD  Multiple Vitamin (MULTIVITAMIN WITH MINERALS) TABS tablet Take 1 tablet by mouth daily. 08/08/18   Darlin DropHall, Carole N, DO  oxyCODONE (OXY IR/ROXICODONE) 5 MG immediate release tablet Take 1 tablet (5 mg total) by mouth 2 (two) times daily as needed for severe pain. 08/07/18   Darlin DropHall, Carole N, DO  pantoprazole (PROTONIX) 40 MG tablet Take 1 tablet (40 mg total) by mouth daily. 08/07/18   Darlin DropHall, Carole N, DO  simethicone (MYLICON) 40 MG/0.6ML drops Take 1.2 mLs (80 mg total) by mouth every 8 (eight) hours as needed for flatulence. 08/07/18   Darlin DropHall, Carole N, DO     No family history on file.  Social History   Socioeconomic History  . Marital status: Married    Spouse name: Not on file  . Number of children: Not on file  . Years of education: Not on file  . Highest education level: Not on file  Occupational History  . Not on file  Social Needs  . Financial resource strain: Not on file  . Food insecurity:    Worry: Not on file    Inability: Not on file  . Transportation needs:    Medical: Not on file    Non-medical: Not on file  Tobacco Use  . Smoking status: Former Smoker  Types: Cigarettes  . Smokeless tobacco: Never Used  Substance and Sexual Activity  . Alcohol use: Yes    Comment: occ  . Drug use: Yes    Types: Marijuana  . Sexual activity: Not on file  Lifestyle  . Physical activity:    Days per week: Not on file    Minutes per session: Not on file  . Stress: Not on file  Relationships  . Social connections:    Talks on phone: Not on file    Gets together: Not on file    Attends religious service: Not on file    Active member of club or organization: Not on file    Attends  meetings of clubs or organizations: Not on file    Relationship status: Not on file  Other Topics Concern  . Not on file  Social History Narrative  . Not on file     Vital Signs: BP 137/83 (BP Location: Right Arm)   Pulse 83   Temp 98.3 F (36.8 C) (Oral)   SpO2 98%   Physical Exam  NAD, alert Abdomen: soft, non-tender.  LLQ drain in place.  Insertion site intact. No erythema around insertion site, however some excoriations around dressing. Small amount of serosanguinous fluid in bulb.    Imaging: Ir Radiologist Eval & Mgmt  Result Date: 08/17/2018 Please refer to notes tab for details about interventional procedure. (Op Note)   Labs:  CBC: Recent Labs    08/04/18 0458 08/05/18 0353 08/06/18 0421 08/07/18 0436  WBC 10.5 12.2* 11.0* 8.5  HGB 13.1 12.8* 13.9 13.4  HCT 38.4* 38.2* 40.3 40.2  PLT 436* 486* 515* 529*    COAGS: Recent Labs    08/02/18 0735  INR 1.1    BMP: Recent Labs    08/04/18 0458 08/05/18 0353 08/06/18 0421 08/07/18 0436  NA 138 137 139 137  K 3.4* 3.7 3.6 3.6  CL 103 103 103 103  CO2 23 25 24 23   GLUCOSE 110* 104* 99 103*  BUN 9 7 6 11   CALCIUM 8.7* 8.8* 9.1 9.0  CREATININE 0.78 0.80 0.80 0.82  GFRNONAA >60 >60 >60 >60  GFRAA >60 >60 >60 >60    LIVER FUNCTION TESTS: Recent Labs    07/28/18 1700 08/01/18 0625  BILITOT 0.5 0.8  AST 21 16  ALT 20 19  ALKPHOS 60 53  PROT 7.2 7.0  ALBUMIN 3.9 2.6*    Assessment: Perforated diverticulitis with pneumoperitoneum Patient presents to drain clinic today for evaluation and management of his drain which remains intact today. A small amount of serosanguinous output remains in bulb.  He has not been flushing.  CT Abdomen/Pelvis as well as drain injection performed and reviewed by Dr. Fredia Sorrow.  Patient with ongoing evidence of diverticulitis, however no remaining collection or demonstrated fistula at current drain location.  Drain removed today in its entirety and without  complication.  Dressing placed.  Patient encouraged to continue antibiotics as prescribed and to contact surgeons office for appointment. He is provided with contact information for [facility].  No follow-up needed in IR at this time.   Signed: Hoyt Koch, PA 08/17/2018, 3:01 PM   Please refer to Dr. Fredia Sorrow attestation of this note for management and plan.

## 2018-10-20 ENCOUNTER — Encounter: Payer: Self-pay | Admitting: Gastroenterology

## 2018-10-21 ENCOUNTER — Ambulatory Visit: Payer: Self-pay | Admitting: Gastroenterology

## 2018-11-05 ENCOUNTER — Ambulatory Visit: Payer: Self-pay | Admitting: Gastroenterology

## 2018-11-05 NOTE — Progress Notes (Deleted)
              Chief Complaint:    Referring Provider:         HPI:     Jay Davis is a 38 y.o. male referred to the Gastroenterology Clinic for evaluation of    Past Medical History:  Diagnosis Date  . Bipolar 1 disorder ([place])   . Depression      Past Surgical History:  Procedure Laterality Date  . IR RADIOLOGIST EVAL & MGMT  08/17/2018   No family history on file. Social History   Tobacco Use  . Smoking status: Former Smoker    Types: Cigarettes  . Smokeless tobacco: Never Used  Substance Use Topics  . Alcohol use: Yes    Comment: occ  . Drug use: Yes    Types: Marijuana   Current Outpatient Medications  Medication Sig Dispense Refill  . acetaminophen (TYLENOL) 325 MG tablet Take 650 mg by mouth every 6 (six) hours as needed.    Marland Kitchen albuterol (PROVENTIL HFA;VENTOLIN HFA) 108 (90 BASE) MCG/ACT inhaler Inhale 1-2 puffs into the lungs every 6 (six) hours as needed for wheezing. 1 Inhaler 0  . loratadine (CLARITIN) 10 MG tablet Take 1 tablet (10 mg total) by mouth daily. (Patient not taking: Reported on 07/29/2018) 5 tablet 0  . Multiple Vitamin (MULTIVITAMIN WITH MINERALS) TABS tablet Take 1 tablet by mouth daily. 30 tablet 0  . oxyCODONE (OXY IR/ROXICODONE) 5 MG immediate release tablet Take 1 tablet (5 mg total) by mouth 2 (two) times daily as needed for severe pain. 10 tablet 0  . pantoprazole (PROTONIX) 40 MG tablet Take 1 tablet (40 mg total) by mouth daily. 30 tablet 0  . simethicone (MYLICON) 40 HU/3.1SH drops Take 1.2 mLs (80 mg total) by mouth every 8 (eight) hours as needed for flatulence. 30 mL 0   No current facility-administered medications for this visit.    Allergies  Allergen Reactions  . 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 Pork-Derived Products      Review of Systems: All systems reviewed and negative except where noted in HPI.     Physical Exam:    Wt Readings from Last 3 Encounters:   08/07/18 158 lb 9.6 oz (71.9 kg)  09/22/17 176 lb (79.8 kg)  10/07/16 178 lb (80.7 kg)    There were no vitals taken for this visit. Constitutional:  Pleasant, in no acute distress. Psychiatric: Normal mood and affect. Behavior is normal. EENT: Pupils normal.  Conjunctivae are normal. No scleral icterus. Neck supple. No cervical LAD. Cardiovascular: Normal rate, regular rhythm. No edema Pulmonary/chest: Effort normal and breath sounds normal. No wheezing, rales or rhonchi. Abdominal: Soft, nondistended, nontender. Bowel sounds active throughout. There are no masses palpable. No hepatomegaly. Neurological: Alert and oriented to person place and time. Skin: Skin is warm and dry. No rashes noted.   ASSESSMENT AND PLAN;   Jay Davis is a 38 y.o. male presenting with    [place], DO, FACG  11/05/2018, 2:28 PM   No ref. provider found

## 2018-11-10 ENCOUNTER — Ambulatory Visit: Payer: Self-pay | Admitting: Nurse Practitioner

## 2018-11-19 ENCOUNTER — Other Ambulatory Visit: Payer: Self-pay

## 2018-11-19 ENCOUNTER — Encounter: Payer: Self-pay | Admitting: Gastroenterology

## 2018-11-19 ENCOUNTER — Telehealth (INDEPENDENT_AMBULATORY_CARE_PROVIDER_SITE_OTHER): Payer: Self-pay | Admitting: Gastroenterology

## 2018-11-19 ENCOUNTER — Ambulatory Visit: Payer: Self-pay | Admitting: Gastroenterology

## 2018-11-19 VITALS — Ht 66.0 in | Wt 174.0 lb

## 2018-11-19 DIAGNOSIS — R1030 Lower abdominal pain, unspecified: Secondary | ICD-10-CM

## 2018-11-19 DIAGNOSIS — K5732 Diverticulitis of large intestine without perforation or abscess without bleeding: Secondary | ICD-10-CM

## 2018-11-19 MED ORDER — NA SULFATE-K SULFATE-MG SULF 17.5-3.13-1.6 GM/177ML PO SOLN: 1.0000 | ORAL | 0 refills | Status: AC

## 2018-11-19 NOTE — Progress Notes (Signed)
Chief Complaint: History of diverticulitis, abdominal pain  Referring Provider:     Nadeen Landau, MD  HPI:    Due to current restrictions/limitations of in-office visits due to the COVID-19 pandemic, this scheduled clinical appointment was converted to a telehealth virtual consultation using D[employer]imity.  Due to patient having issues establishing connection through app, this was converted to a telephone encounter.  -Time of medical discussion: 24 minutes -The patient did consent to this virtual visit and is aware of possible charges through their insurance for this visit.  -Names of all parties present: Jay Davis (patient), Gerrit Heck, DO, [facility] (physician) -Patient location: Home -Physician location: Office  Jay Davis is a 38 y.o. male with a history of asthma, depression, bipolar disorder, prior tobacco use disorder, marijuana use, referred to the Gastroenterology Clinic for evaluation following recent admission in 07/2018 for diverticulitis.  Admitted 5/13-23 with acute diverticulitis.  Treated with IV Flagyl/Cipro, transition to IV Zosyn.  No perforation/abscess on admission CT, but with worsening pain, repeat CT on 5/17 demonstrated perforated descending colon with 2 intra-abdominal abscesses and partial SBO.  IR placed drain on 5/18 with clinical improvement.  Blood cultures negative.  Abscess culture with Pseudomonas and yeast, treated with IV fluconazole. Continued on PO Abx for 7 days as outpatient.   IR removed drain on 6/2. Followed up with Dr. Dema Severin at [facility] on 09/15/18 with recommendation for colonoscopy and possible future segmental resection. Was given additional course of Cipro/Flagyl x10 days for ongoing LLQ pain.   Today, he states he will still occasionally have vague, crampy LLQ discomfort, which typically resolves with flatus/BM. No diarrhea, constipation, hematochezia, melena. Otherwise back to his usual state of health.  Tolerating  all p.o. intake.  No fever, chills.  No labs or rads after hospital d/c for review today.   No known family history of CRC, GI malignancy, liver disease, pancreatic disease, or IBD.   Past medical history, past surgical history, social history, family history, medications, and allergies reviewed in the chart and with patient.    Past Medical History:  Diagnosis Date  . Asthma   . Bipolar 1 disorder ([place])   . Depression   . Diverticulitis      Past Surgical History:  Procedure Laterality Date  . IR RADIOLOGIST EVAL & MGMT  08/17/2018   Family History  Problem Relation Age of Onset  . Colon cancer Neg Hx   . Esophageal cancer Neg Hx    Social History   Tobacco Use  . Smoking status: Former Smoker    Types: Cigarettes  . Smokeless tobacco: Never Used  Substance Use Topics  . Alcohol use: Yes    Comment: occ  . Drug use: Yes    Types: Marijuana   Current Outpatient Medications  Medication Sig Dispense Refill  . acetaminophen (TYLENOL) 325 MG tablet Take 650 mg by mouth every 6 (six) hours as needed.    Marland Kitchen albuterol (PROVENTIL HFA;VENTOLIN HFA) 108 (90 BASE) MCG/ACT inhaler Inhale 1-2 puffs into the lungs every 6 (six) hours as needed for wheezing. 1 Inhaler 0  . Multiple Vitamin (MULTIVITAMIN WITH MINERALS) TABS tablet Take 1 tablet by mouth daily. 30 tablet 0  . Simethicone (GAS-X PO) Take 1 tablet by mouth as needed.    . loratadine (CLARITIN) 10 MG tablet Take 1 tablet (10 mg total) by mouth daily. (Patient not taking: Reported on 07/29/2018) 5 tablet 0  .  pantoprazole (PROTONIX) 40 MG tablet Take 1 tablet (40 mg total) by mouth daily. (Patient not taking: Reported on 11/19/2018) 30 tablet 0   No current facility-administered medications for this visit.    Allergies  Allergen Reactions  . 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 Pork-Derived Products      Review of Systems: All systems reviewed and negative except where  noted in HPI.     Physical Exam:    Complete physical exam not completed due to the nature of this telehealth communication.   Gen: Awake, alert, and oriented, and well communicative. Psych: Pleasant, cooperative, normal speech, thought processing seemingly intact   ASSESSMENT AND PLAN;   1) History of diverticulitis c/b abscess and perforation 2) Lower abdominal pain  -Colonoscopy -Evaluate for mucosal/luminal etiology for ongoing lower GI complaints, to include Diverticular Associated Colitis, IBD, etc. at time of colonoscopy - Pending results of colonoscopy, plan to follow-up with Dr. Cliffton AstersWhite at [facility] for consideration of segmental resection given history of complicated diverticulitis with perforation and abscess.  The indications, risks, and benefits of colonoscopy were explained to the patient in detail. Risks include but are not limited to bleeding, perforation, adverse reaction to medications, and cardiopulmonary compromise. Sequelae include but are not limited to the possibility of surgery, hospitalization, and mortality. The patient verbalized understanding and wished to proceed. All questions answered, referred to the scheduler and bowel prep ordered. Further recommendations pending results of the exam.     Verlin DikeVito V Demara Lover, DO, FACG  11/19/2018, 2:21 PM   No ref. provider found

## 2018-12-03 ENCOUNTER — Ambulatory Visit: Payer: Self-pay | Admitting: Gastroenterology

## 2018-12-08 ENCOUNTER — Telehealth: Payer: Self-pay

## 2018-12-08 NOTE — Telephone Encounter (Signed)
Yesi, I have entered the english prep instructions -will you please enter them with the instructions in spanish please-and then call him and let him know that I am printing them and will mail them to him? Please/thank you Bre

## 2018-12-08 NOTE — Telephone Encounter (Signed)
Patient was r/s and would like new instructions for his colonoscopy. Also he is asking if he could have a cheaper prep. States the prep prescribed is too expensive.

## 2018-12-09 ENCOUNTER — Encounter: Payer: Self-pay | Admitting: Gastroenterology

## 2018-12-10 NOTE — Telephone Encounter (Signed)
Instructions mailed to pt.

## 2018-12-29 ENCOUNTER — Telehealth: Payer: Self-pay

## 2018-12-29 NOTE — Telephone Encounter (Signed)
Covid-19 screening questions   Do you now or have you had a fever in the last 14 days?  Do you have any respiratory symptoms of shortness of breath or cough now or in the last 14 days?  Do you have any family members or close contacts with diagnosed or suspected Covid-19 in the past 14 days?  Have you been tested for Covid-19 and found to be positive?       

## 2018-12-30 ENCOUNTER — Encounter: Payer: Self-pay | Admitting: Gastroenterology

## 2019-03-17 ENCOUNTER — Emergency Department (HOSPITAL_BASED_OUTPATIENT_CLINIC_OR_DEPARTMENT_OTHER)
Admission: EM | Admit: 2019-03-17 | Discharge: 2019-03-18 | Disposition: A | Discharge status: Home / Self Care | Payer: Self-pay | Attending: Emergency Medicine | Admitting: Emergency Medicine

## 2019-03-17 ENCOUNTER — Other Ambulatory Visit: Payer: Self-pay

## 2019-03-17 ENCOUNTER — Encounter (HOSPITAL_BASED_OUTPATIENT_CLINIC_OR_DEPARTMENT_OTHER): Payer: Self-pay

## 2019-03-17 DIAGNOSIS — Y999 Unspecified external cause status: Secondary | ICD-10-CM | POA: Insufficient documentation

## 2019-03-17 DIAGNOSIS — Y939 Activity, unspecified: Secondary | ICD-10-CM | POA: Insufficient documentation

## 2019-03-17 DIAGNOSIS — Z5321 Procedure and treatment not carried out due to patient leaving prior to being seen by health care provider: Secondary | ICD-10-CM | POA: Insufficient documentation

## 2019-03-17 DIAGNOSIS — Y929 Unspecified place or not applicable: Secondary | ICD-10-CM | POA: Insufficient documentation

## 2019-03-17 DIAGNOSIS — S61411A Laceration without foreign body of right hand, initial encounter: Secondary | ICD-10-CM | POA: Insufficient documentation

## 2019-03-17 DIAGNOSIS — W268XXA Contact with other sharp object(s), not elsewhere classified, initial encounter: Secondary | ICD-10-CM | POA: Insufficient documentation

## 2019-03-17 NOTE — ED Triage Notes (Signed)
Pt states he cut right hand on metal piece of furniture ~2 hours PTA-lac noted to palm of hand-no bleeding-4x4/kling dsg applied-NAD-steady gait

## 2019-06-17 ENCOUNTER — Encounter (HOSPITAL_BASED_OUTPATIENT_CLINIC_OR_DEPARTMENT_OTHER): Payer: Self-pay | Admitting: *Deleted

## 2019-06-17 ENCOUNTER — Emergency Department (HOSPITAL_BASED_OUTPATIENT_CLINIC_OR_DEPARTMENT_OTHER)
Admission: EM | Admit: 2019-06-17 | Discharge: 2019-06-17 | Disposition: A | Discharge status: Home / Self Care | Payer: Self-pay | Attending: Emergency Medicine | Admitting: Emergency Medicine

## 2019-06-17 ENCOUNTER — Other Ambulatory Visit: Payer: Self-pay

## 2019-06-17 DIAGNOSIS — J45909 Unspecified asthma, uncomplicated: Secondary | ICD-10-CM | POA: Insufficient documentation

## 2019-06-17 DIAGNOSIS — L298 Other pruritus: Secondary | ICD-10-CM | POA: Insufficient documentation

## 2019-06-17 DIAGNOSIS — Z87891 Personal history of nicotine dependence: Secondary | ICD-10-CM | POA: Insufficient documentation

## 2019-06-17 DIAGNOSIS — R5381 Other malaise: Secondary | ICD-10-CM

## 2019-06-17 DIAGNOSIS — Z79899 Other long term (current) drug therapy: Secondary | ICD-10-CM | POA: Insufficient documentation

## 2019-06-17 MED ORDER — CETIRIZINE HCL 10 MG PO TABS: 10.0000 mg | ORAL_TABLET | Freq: Every day | ORAL | 0 refills | Status: DC | PRN

## 2019-06-17 NOTE — ED Triage Notes (Signed)
His wife states he was started on a new a medication by [facility]  in February. He has been getting the injections monthly. Last injection was Monday. He was seen at [facility] 2 days later with lethargy, itching, agitation and shuffling of his feet. He was given Hydroxyzine with some relief. Here because he is not back to his normal state and he feels afraid.

## 2019-06-17 NOTE — ED Provider Notes (Signed)
[facility] EMERGENCY DEPARTMENT Provider Note   CSN: [id number] Arrival date & time: 06/17/19  1421     History Chief Complaint  Patient presents with  . Medication Reaction    Jay Davis is a 39 y.o. male.  Presents ER with concern for medication reaction.  Patient reports that he received a injection at an outpatient office earlier in the week.  Unsure what it was.  States it was at a mental health clinic.  Since then he reports that he has been having generalized itching, concerned about allergic reaction.  Feels very nervous about this.  Also feels that he has had very little energy lately.  Denies any pain.  Went to [facility] and they gave patient an IM Decadron, prescribed Atarax.  Reports he took the Atarax and did not notice any improvement.  Denies rash, difficulty breathing, throat swelling, or other new symptom.   HPI     Past Medical History:  Diagnosis Date  . Asthma   . Bipolar 1 disorder ([place])   . Depression   . Diverticulitis     Patient Active Problem List   Diagnosis Date Noted  . Acute diverticulitis 07/28/2018  . Sepsis ([place]) 07/28/2018  . Asthma 07/28/2018  . Depression 07/28/2018    Past Surgical History:  Procedure Laterality Date  . IR RADIOLOGIST EVAL & MGMT  08/17/2018       Family History  Problem Relation Age of Onset  . Colon cancer Neg Hx   . Esophageal cancer Neg Hx     Social History   Tobacco Use  . Smoking status: Former Smoker    Types: Cigarettes  . Smokeless tobacco: Never Used  Substance Use Topics  . Alcohol use: Yes    Comment: occ  . Drug use: Yes    Types: Marijuana    Home Medications Prior to Admission medications   Medication Sig Start Date End Date Taking? Authorizing Provider  acetaminophen (TYLENOL) 325 MG tablet Take 650 mg by mouth every 6 (six) hours as needed.    [provider]  albuterol (PROVENTIL HFA;VENTOLIN HFA) 108 (90 BASE) MCG/ACT inhaler Inhale 1-2 puffs into  the lungs every 6 (six) hours as needed for wheezing. 01/04/13   Palumbo, April, MD  cetirizine (ZYRTEC ALLERGY) 10 MG tablet Take 1 tablet (10 mg total) by mouth daily as needed for allergies. 06/17/19   Lucrezia Starch, MD  loratadine (CLARITIN) 10 MG tablet Take 1 tablet (10 mg total) by mouth daily. Patient not taking: Reported on 07/29/2018 01/04/13   Palumbo, April, MD  Multiple Vitamin (MULTIVITAMIN WITH MINERALS) TABS tablet Take 1 tablet by mouth daily. 08/08/18   Kayleen Memos, DO  pantoprazole (PROTONIX) 40 MG tablet Take 1 tablet (40 mg total) by mouth daily. Patient not taking: Reported on 11/19/2018 08/07/18   Kayleen Memos, DO  Simethicone (GAS-X PO) Take 1 tablet by mouth as needed.    [provider]    Allergies    Ambien [zolpidem tartrate] and Pork-derived products  Review of Systems   Review of Systems  Constitutional: Negative for chills and fever.  HENT: Negative for ear pain and sore throat.   Eyes: Negative for pain and visual disturbance.  Respiratory: Negative for cough and shortness of breath.   Cardiovascular: Negative for chest pain and palpitations.  Gastrointestinal: Negative for abdominal pain and vomiting.  Genitourinary: Negative for dysuria and hematuria.  Musculoskeletal: Negative for arthralgias and back pain.  Skin:  Negative for color change and rash.  Neurological: Negative for seizures and syncope.  All other systems reviewed and are negative.   Physical Exam Updated Vital Signs BP 120/79 (BP Location: Left Arm)   Pulse 90   Temp 98.1 F (36.7 C) (Oral)   Resp 16   Ht 5\' 6"  (1.676 m)   Wt 83.9 kg   SpO2 99%   BMI 29.86 kg/m   Physical Exam Vitals and nursing note reviewed.  Constitutional:      Appearance: He is well-developed.  HENT:     Head: Normocephalic and atraumatic.  Eyes:     Conjunctiva/sclera: Conjunctivae normal.  Cardiovascular:     Rate and Rhythm: Normal rate and regular rhythm.     Heart sounds: No  murmur.  Pulmonary:     Effort: Pulmonary effort is normal. No respiratory distress.     Breath sounds: Normal breath sounds.  Abdominal:     Palpations: Abdomen is soft.     Tenderness: There is no abdominal tenderness.  Musculoskeletal:     Cervical back: Neck supple.  Skin:    General: Skin is warm and dry.  Neurological:     General: No focal deficit present.     Mental Status: He is alert and oriented to person, place, and time.     ED Results / Procedures / Treatments   Labs (all labs ordered are listed, but only abnormal results are displayed) Labs Reviewed - No data to display  EKG None  Radiology No results found.  Procedures Procedures (including critical care time)  Medications Ordered in ED Medications - No data to display  ED Course  I have reviewed the triage vital signs and the nursing notes.  Pertinent labs & imaging results that were available during my care of the patient were reviewed by me and considered in my medical decision making (see chart for details).    MDM Rules/Calculators/A&P                      39 year old male presenting to ER with concern for ongoing reaction from injection in outpatient mental health clinic.  On exam patient noted to be very well-appearing.  He does not have any rashes, no urticaria.  No signs or symptoms to suggest anaphylaxis.  Prescribed Atarax at outside hospital which patient reports is not helping. Offered to obtain basic labs today regarding his generalized malaise.  Patient declined only wanting new Rx.  Recommended he try different antihistamine.  Gave Rx for cetirizine.  Reviewed return precautions, recommend recheck with primary doctor.    After the discussed management above, the patient was determined to be safe for discharge.  The patient was in agreement with this plan and all questions regarding their care were answered.  ED return precautions were discussed and the patient will return to the ED with  any significant worsening of condition.   Final Clinical Impression(s) / ED Diagnoses Final diagnoses:  Malaise    Rx / DC Orders ED Discharge Orders         Ordered    cetirizine (ZYRTEC ALLERGY) 10 MG tablet  Daily PRN     06/17/19 1528           08/17/19, MD 06/17/19 2313

## 2019-06-17 NOTE — Discharge Instructions (Addendum)
Please schedule follow up with your primary doctor regarding symptoms today. Recommend taking benadryl and prescribed zyrtec as needed for itchiness. Note these medications can make you drowsy and you should not take while driving. Return here for any new weakness, numbness, difficulty breathing, or other new symptom.

## 2020-01-19 IMAGING — RF ABSCESS INJECTION
1 series · 4 of 4 positions shown · non-contrast
Comparison: none

INDICATION: Ruptured diverticulitis and status post percutaneous drainage
catheter placement in left lower quadrant abscess on 08/02/2018.

[Series 2: sequence · 4 of 134 frames shown]
[frame 4/134]
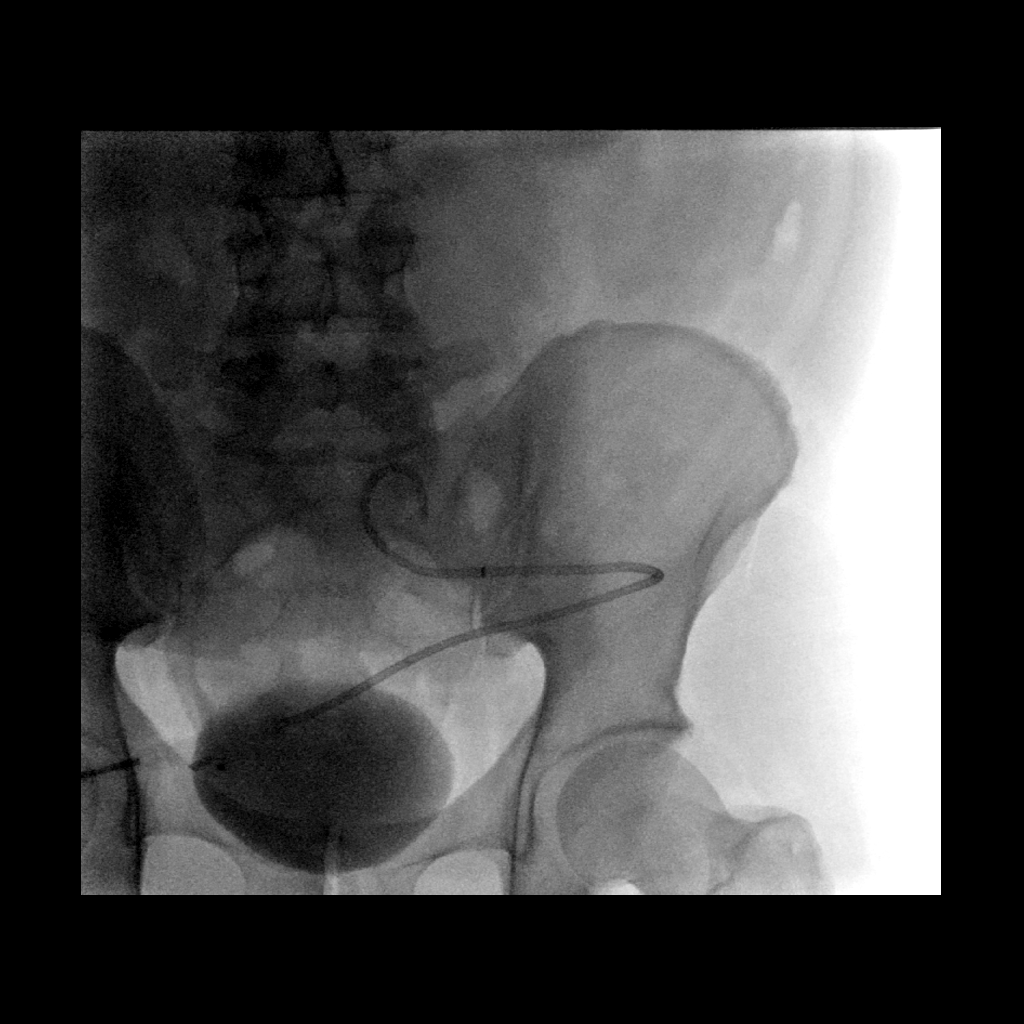
[frame 21/134]
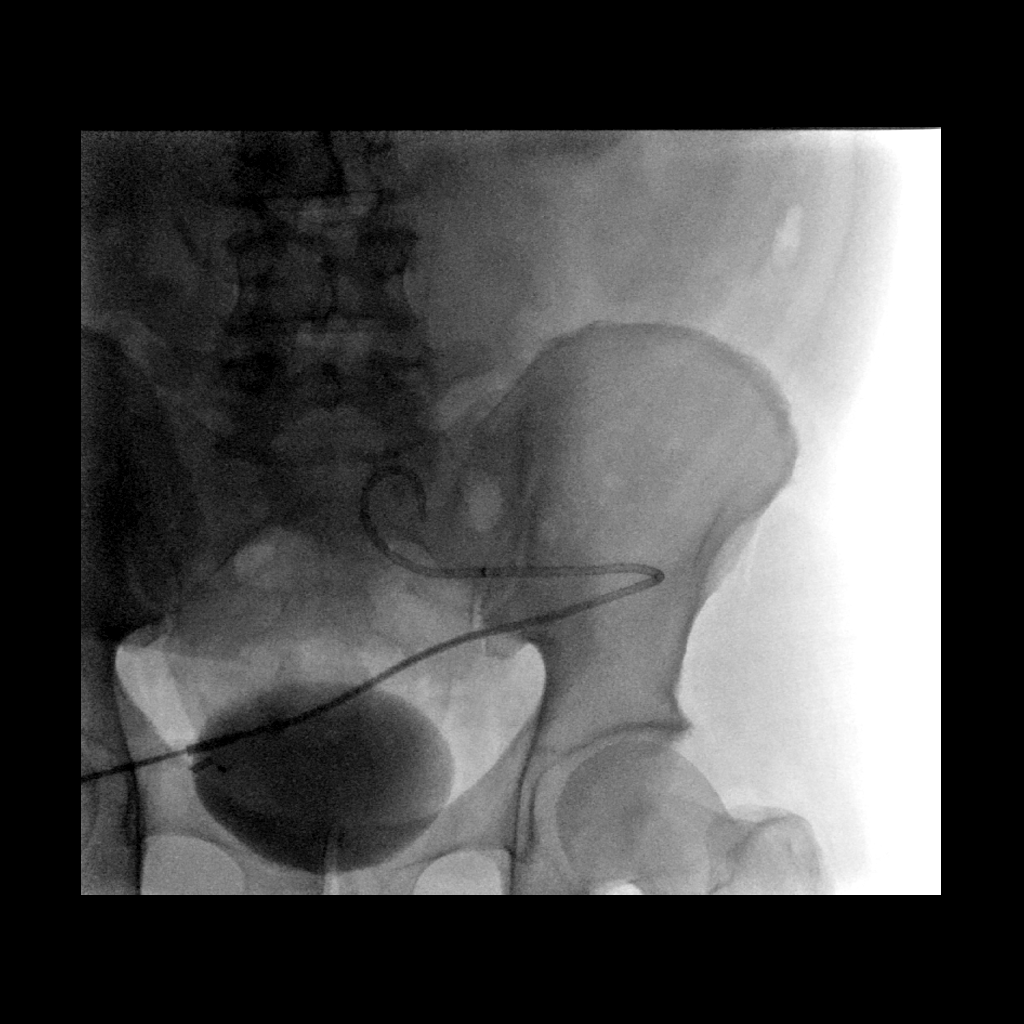
[frame 68/134]
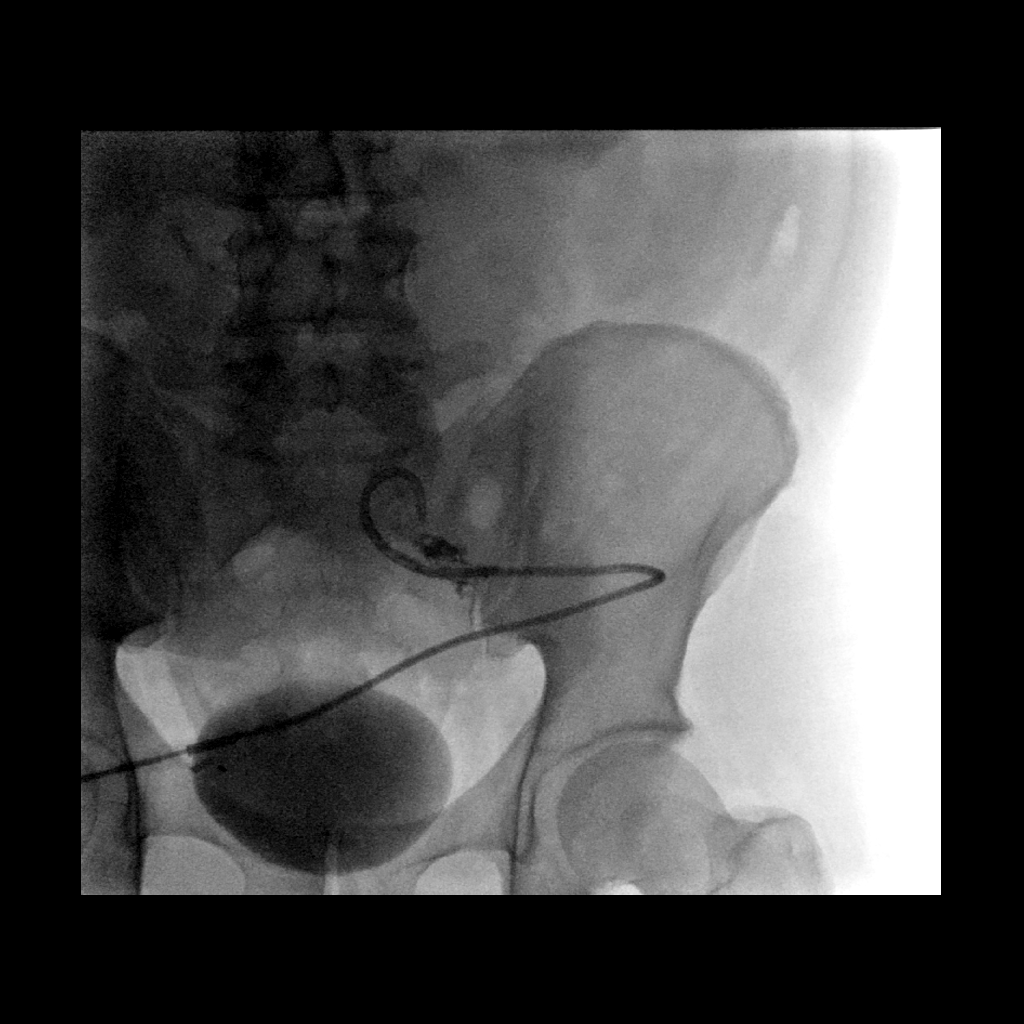
[frame 114/134]
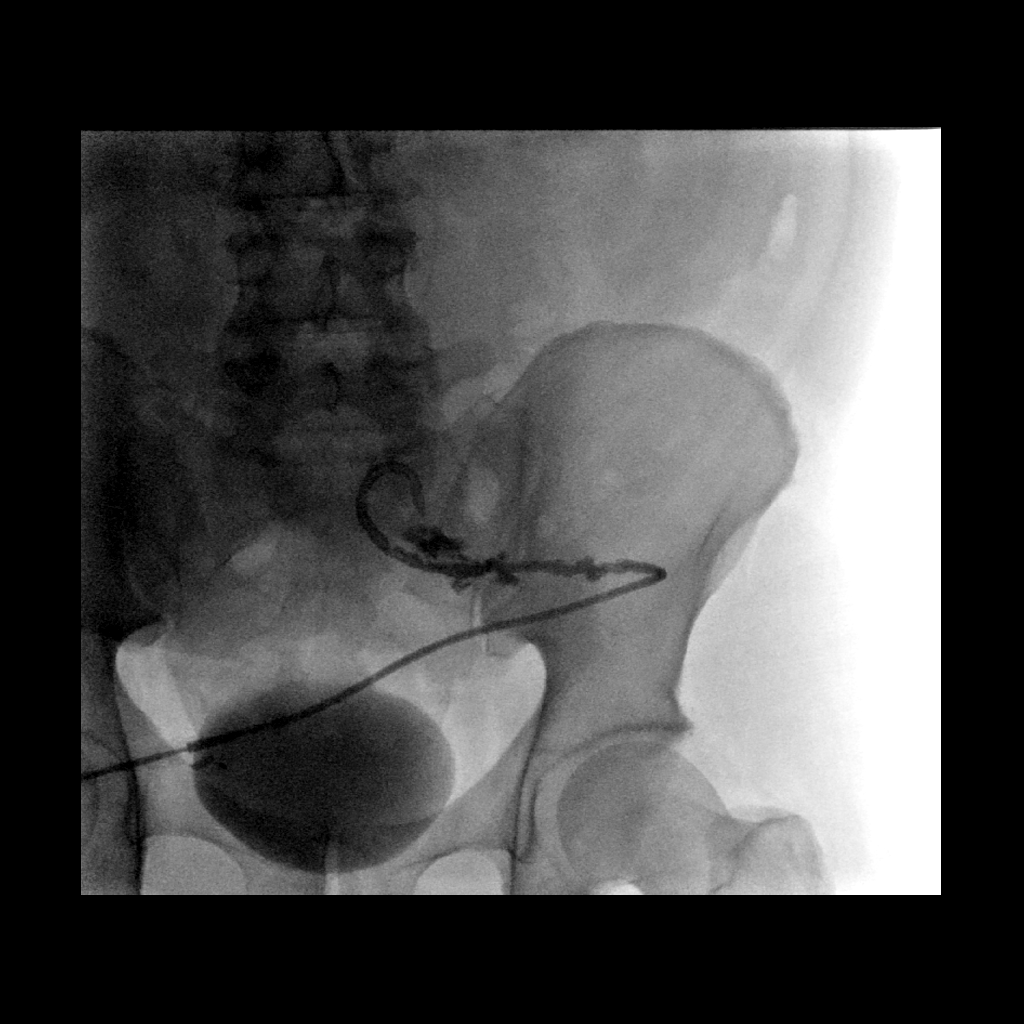

[4 of 4 positions shown; findings below may reference images not displayed]

EXAM:
INJECTION OF PERCUTANEOUS ABSCESS DRAINAGE CATHETER UNDER
FLUOROSCOPY

MEDICATIONS:
None

ANESTHESIA/SEDATION:
None

COMPLICATIONS:
None immediate.

CONTRAST:  10 mL Omnipaque

FLUOROSCOPY TIME:  36 seconds.  38.7 mGy.

PROCEDURE:
Contrast was injected via the pre-existing drainage catheter under
fluoroscopy and a cine sequence saved. The drainage catheter was
then removed.
FINDINGS: Injection of the drainage catheter shows no evidence of a
significant abscess cavity or fistula to bowel. The distal portion
of the catheter is likely obstructed with contrast largely tracking
along the percutaneous tract of the catheter. Due to injection
findings as well as minimal output from the catheter clinically, the
drain was removed following the injection procedure.
IMPRESSION: Injection of the percutaneous drainage catheter demonstrates no
fistula to bowel. The drain was removed following the injection
procedure.

## 2020-06-12 ENCOUNTER — Emergency Department (HOSPITAL_BASED_OUTPATIENT_CLINIC_OR_DEPARTMENT_OTHER): Payer: Self-pay

## 2020-06-12 ENCOUNTER — Emergency Department (HOSPITAL_BASED_OUTPATIENT_CLINIC_OR_DEPARTMENT_OTHER)
Admission: EM | Admit: 2020-06-12 | Discharge: 2020-06-12 | Disposition: A | Discharge status: Home / Self Care | Payer: Self-pay | Attending: Emergency Medicine | Admitting: Emergency Medicine

## 2020-06-12 ENCOUNTER — Encounter (HOSPITAL_BASED_OUTPATIENT_CLINIC_OR_DEPARTMENT_OTHER): Payer: Self-pay

## 2020-06-12 ENCOUNTER — Other Ambulatory Visit: Payer: Self-pay

## 2020-06-12 DIAGNOSIS — J45909 Unspecified asthma, uncomplicated: Secondary | ICD-10-CM | POA: Insufficient documentation

## 2020-06-12 DIAGNOSIS — S92425A Nondisplaced fracture of distal phalanx of left great toe, initial encounter for closed fracture: Secondary | ICD-10-CM

## 2020-06-12 DIAGNOSIS — W208XXA Other cause of strike by thrown, projected or falling object, initial encounter: Secondary | ICD-10-CM | POA: Insufficient documentation

## 2020-06-12 DIAGNOSIS — Z87891 Personal history of nicotine dependence: Secondary | ICD-10-CM | POA: Insufficient documentation

## 2020-06-12 DIAGNOSIS — S92422A Displaced fracture of distal phalanx of left great toe, initial encounter for closed fracture: Secondary | ICD-10-CM | POA: Insufficient documentation

## 2020-06-12 NOTE — ED Provider Notes (Signed)
[facility] EMERGENCY DEPARTMENT Provider Note   CSN: [id number] Arrival date & time: 06/12/20  2137     History Chief Complaint  Patient presents with  . Toe Injury    Jay Davis is a 40 y.o. male.  HPI   Patient with no significant medical history presents to the emergency department with chief complaint of left great toe pain.  He endorses today around 10 AM he dropped a large bag of tools onto his toe.  He endorsed severe pain when this happened, worsening pain with ambulation, denies  paresthesia or weakness in his toe.  He denies hitting his head, losing conscious, is not on anticoagulant, he is not immunocompromise states he is up-to-date on his tetanus shot.  Patient denies  alleviating factors.  Patient denies headaches, fevers, chills, shortness of breath, chest pain, abdominal pain, nausea, vomiting, diarrhea, worsening pedal edema.  Past Medical History:  Diagnosis Date  . Asthma   . Bipolar 1 disorder ([facility])   . Depression   . Diverticulitis     Patient Active Problem List   Diagnosis Date Noted  . Acute diverticulitis 07/28/2018  . Sepsis ([facility]) 07/28/2018  . Asthma 07/28/2018  . Depression 07/28/2018    Past Surgical History:  Procedure Laterality Date  . IR RADIOLOGIST EVAL & MGMT  08/17/2018       Family History  Problem Relation Age of Onset  . Colon cancer Neg Hx   . Esophageal cancer Neg Hx     Social History   Tobacco Use  . Smoking status: Former Smoker    Types: Cigarettes  . Smokeless tobacco: Never Used  Vaping Use  . Vaping Use: Never used  Substance Use Topics  . Alcohol use: Not Currently  . Drug use: Yes    Types: Marijuana    Home Medications Prior to Admission medications   Medication Sig Start Date End Date Taking? Authorizing Provider  acetaminophen (TYLENOL) 325 MG tablet Take 650 mg by mouth every 6 (six) hours as needed.    [provider]  albuterol (PROVENTIL HFA;VENTOLIN HFA) 108 (90 BASE)  MCG/ACT inhaler Inhale 1-2 puffs into the lungs every 6 (six) hours as needed for wheezing. 01/04/13   Palumbo, April, MD  cetirizine (ZYRTEC ALLERGY) 10 MG tablet Take 1 tablet (10 mg total) by mouth daily as needed for allergies. 06/17/19   Milagros Loll, MD  loratadine (CLARITIN) 10 MG tablet Take 1 tablet (10 mg total) by mouth daily. Patient not taking: Reported on 07/29/2018 01/04/13   Palumbo, April, MD  Multiple Vitamin (MULTIVITAMIN WITH MINERALS) TABS tablet Take 1 tablet by mouth daily. 08/08/18   Darlin Drop, DO  pantoprazole (PROTONIX) 40 MG tablet Take 1 tablet (40 mg total) by mouth daily. Patient not taking: Reported on 11/19/2018 08/07/18   Darlin Drop, DO  Simethicone (GAS-X PO) Take 1 tablet by mouth as needed.    [provider]    Allergies    Ambien [zolpidem tartrate] and Pork-derived products  Review of Systems   Review of Systems  Constitutional: Negative for chills and fever.  HENT: Negative for congestion.   Respiratory: Negative for shortness of breath.   Cardiovascular: Negative for chest pain.  Gastrointestinal: Negative for abdominal pain.  Genitourinary: Negative for enuresis.  Musculoskeletal: Negative for back pain.       Pain in his left great toe.  Skin: Negative for rash.  Neurological: Negative for dizziness.  Hematological: Does not bruise/bleed easily.  Physical Exam Updated Vital Signs BP 134/85 (BP Location: Left Arm)   Pulse (!) 103   Temp 97.8 F (36.6 C)   Resp 20   SpO2 97%   Physical Exam Vitals and nursing note reviewed.  Constitutional:      General: He is not in acute distress.    Appearance: He is not ill-appearing.  HENT:     Head: Normocephalic and atraumatic.     Nose: No congestion.  Eyes:     Conjunctiva/sclera: Conjunctivae normal.  Cardiovascular:     Rate and Rhythm: Normal rate and regular rhythm.  Pulmonary:     Effort: Pulmonary effort is normal.  Musculoskeletal:        General: Swelling  and tenderness present.     Right lower leg: No edema.     Left lower leg: No edema.     Comments: Patient's left toe was visualized and had edema without erythema, notable ecchymosis, no laceration abrasions present, nailbed was fully intact no noted blood beneath the nailbed.  Patient has full range of motion in all joints of his big toe, neurovascular fully intact.  Skin:    General: Skin is warm and dry.  Neurological:     Mental Status: He is alert.  Psychiatric:        Mood and Affect: Mood normal.     ED Results / Procedures / Treatments   Labs (all labs ordered are listed, but only abnormal results are displayed) Labs Reviewed - No data to display  EKG None  Radiology DG Toe Great Left  Result Date: 06/12/2020 CLINICAL DATA:  Left great toe pain after injury. Dropped a bag of stools on great toe. EXAM: LEFT GREAT TOE COMPARISON:  None. FINDINGS: Vertical oriented nondisplaced great toe distal tuft fracture. Fracture extends from the distal tuft to the interphalangeal joint. No significant articular offset. No fracture of the proximal phalanx. Alignment is normal. IMPRESSION: Nondisplaced great toe distal tuft fracture extending to the interphalangeal joint. Electronically Signed   By: Narda Rutherford M.D.   On: 06/12/2020 22:09    Procedures Procedures   Medications Ordered in ED Medications - No data to display  ED Course  I have reviewed the triage vital signs and the nursing notes.  Pertinent labs & imaging results that were available during my care of the patient were reviewed by me and considered in my medical decision making (see chart for details).    MDM Rules/Calculators/A&P                         Initial impression-patient presents with injury to his great toe of the left side.  He is alert, does not appear in acute distress, vital signs reassuring.  Will obtain imaging for further evaluation.  Work-up-imaging shows nondisplaced great toe distal tuft  fracture sent to the IPJ joint  Rule out- low suspicion for ligament or tendon damage as area was palpated no gross defects noted, he had full range of motion in all joints of his toe.  Low suspicion for compartment syndrome as area was palpated it was soft to the touch, neurovascular fully intact.  Plan-patient has a fracture in the great toe without displacement.  Placed him in a postop boot, made him nonweightbearing, recommend over-the-counter pain medication follow-up with orthopedic surgery for further evaluation.  Vital signs have remained stable, no indication for hospital admission.   Patient given at home care as well strict return precautions.  Patient verbalized that they understood agreed to said plan.   Final Clinical Impression(s) / ED Diagnoses Final diagnoses:  Nondisplaced fracture of distal phalanx of left great toe, initial encounter for closed fracture    Rx / DC Orders ED Discharge Orders    None       Carroll Sage, PA-C 06/12/20 2230    Tegeler, Canary Brim, MD 06/13/20 [phone]

## 2020-06-12 NOTE — ED Triage Notes (Signed)
Per pt and wife--pt dropped "bag of tools" on left great ~9am-NAD-to triage in w/c

## 2020-06-12 NOTE — Discharge Instructions (Addendum)
You have a fracture of your great toe.  I have placed in a postop boot and provided you with crutches.  I need you to remain nonweightbearing on that foot, I would like you to wear the postop boot during the day,  you may take it off at nighttime.  You may take over-the-counter pain medications like ibuprofen and/or Tylenol every 6 hours as needed please follow dosing back of bottle.  Please keep the foot elevated and apply ice to the area as this will help decrease inflammation and swelling.  You must follow-up with orthopedic surgery for further evaluation, given the contact information above please call.  Come back to the emergency department if you develop chest pain, shortness of breath, severe abdominal pain, uncontrolled nausea, vomiting, diarrhea.

## 2020-08-16 ENCOUNTER — Other Ambulatory Visit: Payer: Self-pay

## 2020-08-16 ENCOUNTER — Encounter (HOSPITAL_BASED_OUTPATIENT_CLINIC_OR_DEPARTMENT_OTHER): Payer: Self-pay | Admitting: *Deleted

## 2020-08-16 ENCOUNTER — Emergency Department (HOSPITAL_BASED_OUTPATIENT_CLINIC_OR_DEPARTMENT_OTHER): Payer: Self-pay

## 2020-08-16 ENCOUNTER — Emergency Department (HOSPITAL_BASED_OUTPATIENT_CLINIC_OR_DEPARTMENT_OTHER)
Admission: EM | Admit: 2020-08-16 | Discharge: 2020-08-16 | Disposition: A | Discharge status: Home / Self Care | Payer: Self-pay | Attending: Emergency Medicine | Admitting: Emergency Medicine

## 2020-08-16 DIAGNOSIS — R0602 Shortness of breath: Secondary | ICD-10-CM | POA: Insufficient documentation

## 2020-08-16 DIAGNOSIS — Z87891 Personal history of nicotine dependence: Secondary | ICD-10-CM | POA: Insufficient documentation

## 2020-08-16 DIAGNOSIS — R0789 Other chest pain: Secondary | ICD-10-CM

## 2020-08-16 DIAGNOSIS — R062 Wheezing: Secondary | ICD-10-CM | POA: Insufficient documentation

## 2020-08-16 DIAGNOSIS — R072 Precordial pain: Secondary | ICD-10-CM | POA: Insufficient documentation

## 2020-08-16 DIAGNOSIS — R079 Chest pain, unspecified: Secondary | ICD-10-CM

## 2020-08-16 LAB — CBC WITH DIFFERENTIAL/PLATELET
Abs Immature Granulocytes: 0.04 10*3/uL (ref 0.00–0.07)
Basophils Absolute: 0.1 10*3/uL (ref 0.0–0.1)
Basophils Relative: 1 %
Eosinophils Absolute: 0.7 10*3/uL — ABNORMAL HIGH (ref 0.0–0.5)
Eosinophils Relative: 7 %
HCT: 44 % (ref 39.0–52.0)
Hemoglobin: 15.1 g/dL (ref 13.0–17.0)
Immature Granulocytes: 0 %
Lymphocytes Relative: 37 %
Lymphs Abs: 3.4 10*3/uL (ref 0.7–4.0)
MCH: 29.9 pg (ref 26.0–34.0)
MCHC: 34.3 g/dL (ref 30.0–36.0)
MCV: 87.1 fL (ref 80.0–100.0)
Monocytes Absolute: 0.7 10*3/uL (ref 0.1–1.0)
Monocytes Relative: 7 %
Neutro Abs: 4.4 10*3/uL (ref 1.7–7.7)
Neutrophils Relative %: 48 %
Platelets: 249 10*3/uL (ref 150–400)
RBC: 5.05 MIL/uL (ref 4.22–5.81)
RDW: 12.8 % (ref 11.5–15.5)
WBC: 9.3 10*3/uL (ref 4.0–10.5)
nRBC: 0 % (ref 0.0–0.2)

## 2020-08-16 LAB — COMPREHENSIVE METABOLIC PANEL
ALT: 28 U/L (ref 0–44)
AST: 27 U/L (ref 15–41)
Albumin: 4 g/dL (ref 3.5–5.0)
Alkaline Phosphatase: 68 U/L (ref 38–126)
Anion gap: 11 (ref 5–15)
BUN: 16 mg/dL (ref 6–20)
CO2: 25 mmol/L (ref 22–32)
Calcium: 8.9 mg/dL (ref 8.9–10.3)
Chloride: 102 mmol/L (ref 98–111)
Creatinine, Ser: 0.88 mg/dL (ref 0.61–1.24)
GFR, Estimated: >60 mL/min (ref >60)
Glucose, Bld: 125 mg/dL — ABNORMAL HIGH (ref 70–99)
Potassium: 3.4 mmol/L — ABNORMAL LOW (ref 3.5–5.1)
Sodium: 138 mmol/L (ref 135–145)
Total Bilirubin: 0.3 mg/dL (ref 0.3–1.2)
Total Protein: 7.2 g/dL (ref 6.5–8.1)

## 2020-08-16 LAB — TROPONIN I (HIGH SENSITIVITY)
Troponin I (High Sensitivity): 3 ng/L (ref <18)
Troponin I (High Sensitivity): 3 ng/L (ref <18)

## 2020-08-16 MED ORDER — CYCLOBENZAPRINE HCL 10 MG PO TABS: 10.0000 mg | ORAL_TABLET | Freq: Two times a day (BID) | ORAL | 0 refills | Status: DC | PRN

## 2020-08-16 NOTE — Discharge Instructions (Signed)
Your history, exam, work-up today are overall reassuring.  I suspect this intermittent sharp chest pain you are having is more related to chest wall discomfort and muscle spasms however your cardiac enzymes and chest x-ray are all reassuring.  We feel you are safe for discharge home given the several hours of monitoring you had with reassuring work-up.  Please use the muscle relaxant to help with discomfort and follow-up with your cardiologist and a primary doctor.  If any symptoms change or worsen acutely, please return to the nearest emergency department.

## 2020-08-16 NOTE — ED Provider Notes (Signed)
[facility] EMERGENCY DEPARTMENT Provider Note   CSN: [id number] Arrival date & time: 08/16/20  2108     History Chief Complaint  Patient presents with  . Chest Pain    Jay Davis is a 40 y.o. male.  The history is provided by the patient and medical records. No language interpreter was used.  Chest Pain Pain location:  Substernal area Pain quality: sharp   Pain radiates to:  Does not radiate Pain severity:  Moderate Onset quality:  Sudden Timing:  Unable to specify Progression:  Resolved Chronicity:  New Context: not trauma   Relieved by:  Nothing Worsened by:  Nothing Ineffective treatments:  None tried Associated symptoms: shortness of breath   Associated symptoms: no abdominal pain, no altered mental status, no anxiety, no back pain, no cough, no diaphoresis, no fatigue, no fever, no headache, no lower extremity edema, no nausea, no palpitations, no syncope, no vomiting and no weakness        Past Medical History:  Diagnosis Date  . Asthma   . Bipolar 1 disorder (HCC)   . Depression   . Diverticulitis     Patient Active Problem List   Diagnosis Date Noted  . Acute diverticulitis 07/28/2018  . Sepsis (HCC) 07/28/2018  . Asthma 07/28/2018  . Depression 07/28/2018    Past Surgical History:  Procedure Laterality Date  . IR RADIOLOGIST EVAL & MGMT  08/17/2018       Family History  Problem Relation Age of Onset  . Colon cancer Neg Hx   . Esophageal cancer Neg Hx     Social History   Tobacco Use  . Smoking status: Former Smoker    Types: Cigarettes  . Smokeless tobacco: Never Used  Vaping Use  . Vaping Use: Never used  Substance Use Topics  . Alcohol use: Not Currently  . Drug use: Yes    Types: Marijuana    Home Medications Prior to Admission medications   Medication Sig Start Date End Date Taking? Authorizing Provider  acetaminophen (TYLENOL) 325 MG tablet Take 650 mg by mouth every 6 (six) hours as needed.    [provider]  albuterol (PROVENTIL HFA;VENTOLIN HFA) 108 (90 BASE) MCG/ACT inhaler Inhale 1-2 puffs into the lungs every 6 (six) hours as needed for wheezing. 01/04/13   Palumbo, April, MD  cetirizine (ZYRTEC ALLERGY) 10 MG tablet Take 1 tablet (10 mg total) by mouth daily as needed for allergies. 06/17/19   Milagros Loll, MD  loratadine (CLARITIN) 10 MG tablet Take 1 tablet (10 mg total) by mouth daily. Patient not taking: Reported on 07/29/2018 01/04/13   Palumbo, April, MD  Multiple Vitamin (MULTIVITAMIN WITH MINERALS) TABS tablet Take 1 tablet by mouth daily. 08/08/18   Darlin Drop, DO  pantoprazole (PROTONIX) 40 MG tablet Take 1 tablet (40 mg total) by mouth daily. Patient not taking: Reported on 11/19/2018 08/07/18   Darlin Drop, DO  Simethicone (GAS-X PO) Take 1 tablet by mouth as needed.    [provider]    Allergies    Ambien [zolpidem tartrate] and Pork-derived products  Review of Systems   Review of Systems  Constitutional: Negative for chills, diaphoresis, fatigue and fever.  HENT: Negative for congestion.   Eyes: Negative for visual disturbance.  Respiratory: Positive for shortness of breath. Negative for cough, chest tightness and wheezing.   Cardiovascular: Positive for chest pain. Negative for palpitations, leg swelling and syncope.  Gastrointestinal: Negative for abdominal pain, constipation, diarrhea, nausea  and vomiting.  Genitourinary: Negative for dysuria, flank pain and frequency.  Musculoskeletal: Negative for back pain, neck pain and neck stiffness.  Skin: Negative for rash and wound.  Neurological: Negative for weakness, light-headedness and headaches.  Psychiatric/Behavioral: Negative for agitation and confusion.  All other systems reviewed and are negative.   Physical Exam Updated Vital Signs BP 125/89 (BP Location: Right Arm)   Pulse 97   Temp 98.1 F (36.7 C) (Oral)   Resp 16   Ht 5\' 4"  (1.626 m)   Wt 81.2 kg   SpO2 96%   BMI 30.73  kg/m   Physical Exam Vitals and nursing note reviewed.  Constitutional:      General: He is not in acute distress.    Appearance: He is well-developed. He is not ill-appearing, toxic-appearing or diaphoretic.  HENT:     Head: Normocephalic and atraumatic.  Eyes:     Conjunctiva/sclera: Conjunctivae normal.  Cardiovascular:     Rate and Rhythm: Normal rate and regular rhythm.     Heart sounds: No murmur heard.   Pulmonary:     Effort: Pulmonary effort is normal. No respiratory distress.     Breath sounds: Examination of the right-lower field reveals wheezing. Examination of the left-lower field reveals wheezing. Wheezing (faint and resolved on reassesssm,ent) present. No decreased breath sounds, rhonchi or rales.  Chest:     Chest wall: No tenderness.  Abdominal:     Palpations: Abdomen is soft.     Tenderness: There is no abdominal tenderness.  Musculoskeletal:     Cervical back: Neck supple.     Right lower leg: No tenderness. No edema.     Left lower leg: No tenderness. No edema.  Skin:    General: Skin is warm and dry.  Neurological:     General: No focal deficit present.     Mental Status: He is alert.  Psychiatric:        Mood and Affect: Mood normal.     ED Results / Procedures / Treatments   Labs (all labs ordered are listed, but only abnormal results are displayed) Labs Reviewed  CBC WITH DIFFERENTIAL/PLATELET - Abnormal; Notable for the following components:      Result Value   Eosinophils Absolute 0.7 (*)    All other components within normal limits  COMPREHENSIVE METABOLIC PANEL - Abnormal; Notable for the following components:   Potassium 3.4 (*)    Glucose, Bld 125 (*)    All other components within normal limits  TROPONIN I (HIGH SENSITIVITY)  TROPONIN I (HIGH SENSITIVITY)    EKG EKG Interpretation  Date/Time:  Thursday August 16 2020 21:17:41 EDT Ventricular Rate:  92 PR Interval:  162 QRS Duration: 88 QT Interval:  360 QTC  Calculation: 445 R Axis:   32 Text Interpretation: Normal sinus rhythm Normal ECG When compared to prior, similar S1Q3T3. No STEMI Confirmed by Theda Belfastegeler, Chris ([id number]) on 08/16/2020 10:08:58 PM   Radiology DG Chest 2 View  Result Date: 08/16/2020 CLINICAL DATA:  Intermittent chest pain for 1 week. EXAM: CHEST - 2 VIEW COMPARISON:  11/06/2012 FINDINGS: The cardiomediastinal contours are normal. The lungs are clear. Pulmonary vasculature is normal. No consolidation, pleural effusion, or pneumothorax. No acute osseous abnormalities are seen. IMPRESSION: Negative radiographs of the chest. Electronically Signed   By: Narda RutherfordMelanie  Sanford M.D.   On: 08/16/2020 21:38    Procedures Procedures   Medications Ordered in ED Medications - No data to display  ED Course  I have reviewed  the triage vital signs and the nursing notes.  Pertinent labs & imaging results that were available during my care of the patient were reviewed by me and considered in my medical decision making (see chart for details).    MDM Rules/Calculators/A&P                          Jay Davis is a 40 y.o. male with a past medical history significant for asthma and previous diverticulitis who presents with left sided chest pain on and off for 1 week.  Patient reports that he has had sharp pain that feels like a stabbing "hooklike pain in his left lateral chest wall lasting about 5 minutes and episode on and off for the last week.  He reports some shortness of breath that is resolved.  Denies fevers, chills, ingestion, or cough.  Denies nausea, vomiting, diaphoresis, leg pain, leg swelling, history of DVT/PE, or any family history of heart disease.  He reports that the symptoms come and go and are currently gone.  He wanted to make sure it was not his heart.  He denies any other complaints or evidence of trauma.  No rashes reported.  On exam, lungs clear and chest is nontender.  Abdomen is nontender.  Good pulses in all extremities.   Legs nontender nonedematous.  Abdomen nontender.  Back is nontender.  Patient well-appearing.  No murmur.  EKG shows no STEMI.  Heart score calculated as a 0.  Clinically I suspect a chest wall or musculoskeletal type pain given the sharp nature that comes and goes.  It was not exertional and was not pleuritic.  We had a discussion and have a low suspicion for a thromboembolic cause of symptoms.  Patient agrees to hold on D-dimer at this time.  Delta troponin was collected and was negative.  Chest x-ray and other labs also reassuring.  Patient is feeling better and had no chest pain during his several hour observation period.  Clinically I feel patient is safe for discharge home.  We discussed follow-up with cardiology for possible event monitor to try to capture these episodes of chest discomfort however we feel he is safe for discharge home.  We will also give improvement for muscle relaxant as I suspect is more musculoskeletal in nature with possible spasm.  He agrees with plan of care and return precautions and was discharged in good condition.   Final Clinical Impression(s) / ED Diagnoses Final diagnoses:  Atypical chest pain  Nonspecific chest pain    Rx / DC Orders ED Discharge Orders         Ordered    cyclobenzaprine (FLEXERIL) 10 MG tablet  2 times daily PRN        08/16/20 2345         Clinical Impression: 1. Atypical chest pain   2. Nonspecific chest pain     Disposition: Discharge  Condition: Good  I have discussed the results, Dx and Tx plan with the pt(& family if present). He/she/they expressed understanding and agree(s) with the plan. Discharge instructions discussed at great length. Strict return precautions discussed and pt &/or family have verbalized understanding of the instructions. No further questions at time of discharge.    New Prescriptions   CYCLOBENZAPRINE (FLEXERIL) 10 MG TABLET    Take 1 tablet (10 mg total) by mouth 2 (two) times daily as needed  for muscle spasms.    Follow Up: [facility] [address] [place] [address] [phone]    [facility] [address] [place] [address] [phone] Schedule an appointment as soon as possible for a visit    [facility] [address] [id number] [place] [address] [phone]       Kiptyn Rafuse, Canary Brim, MD 08/16/20 2356

## 2020-08-16 NOTE — ED Triage Notes (Signed)
C/o chest pain on and off x 1 week , tonight constant chest pain and  SOb

## 2020-09-12 ENCOUNTER — Other Ambulatory Visit: Payer: Self-pay

## 2020-09-12 ENCOUNTER — Emergency Department (HOSPITAL_BASED_OUTPATIENT_CLINIC_OR_DEPARTMENT_OTHER)
Admission: EM | Admit: 2020-09-12 | Discharge: 2020-09-13 | Disposition: A | Discharge status: Home / Self Care | Payer: Self-pay | Attending: Emergency Medicine | Admitting: Emergency Medicine

## 2020-09-12 ENCOUNTER — Emergency Department (HOSPITAL_BASED_OUTPATIENT_CLINIC_OR_DEPARTMENT_OTHER): Payer: Self-pay

## 2020-09-12 ENCOUNTER — Encounter (HOSPITAL_BASED_OUTPATIENT_CLINIC_OR_DEPARTMENT_OTHER): Payer: Self-pay | Admitting: *Deleted

## 2020-09-12 DIAGNOSIS — S62631B Displaced fracture of distal phalanx of left index finger, initial encounter for open fracture: Secondary | ICD-10-CM | POA: Insufficient documentation

## 2020-09-12 DIAGNOSIS — S6710XA Crushing injury of unspecified finger(s), initial encounter: Secondary | ICD-10-CM

## 2020-09-12 DIAGNOSIS — X58XXXA Exposure to other specified factors, initial encounter: Secondary | ICD-10-CM | POA: Insufficient documentation

## 2020-09-12 DIAGNOSIS — Z87891 Personal history of nicotine dependence: Secondary | ICD-10-CM | POA: Insufficient documentation

## 2020-09-12 DIAGNOSIS — J45909 Unspecified asthma, uncomplicated: Secondary | ICD-10-CM | POA: Insufficient documentation

## 2020-09-12 NOTE — ED Triage Notes (Signed)
C/o finger injury with lac x 4 hrs ago

## 2020-09-12 NOTE — ED Notes (Signed)
Patient transported to X-ray 

## 2020-09-13 MED ORDER — CEFAZOLIN SODIUM-DEXTROSE 1-4 GM/50ML-% IV SOLN
1.0000 g | Freq: Once | INTRAVENOUS | Status: AC
  Administered 2020-09-13: 1 g via INTRAVENOUS
  Filled 2020-09-13: qty 50

## 2020-09-13 MED ORDER — BUPIVACAINE HCL 0.5 % IJ SOLN
10.0000 mL | Freq: Once | INTRAMUSCULAR | Status: AC
  Administered 2020-09-13: 10 mL
  Filled 2020-09-13: qty 1

## 2020-09-13 MED ORDER — CEPHALEXIN 500 MG PO CAPS: 500.0000 mg | ORAL_CAPSULE | Freq: Four times a day (QID) | ORAL | 0 refills | Status: AC

## 2020-09-13 NOTE — Discharge Instructions (Addendum)
Dr. Melvyn Novas is a hand specialist who will see you in his office after 2 PM today.  He will probably need a surgical procedure at his office to repair your finger.

## 2020-09-13 NOTE — ED Notes (Signed)
Wound irrigated with 500 cc on NS  Tolerated well

## 2020-09-13 NOTE — ED Notes (Signed)
ED Provider at bedside. 

## 2020-09-13 NOTE — ED Provider Notes (Signed)
[facility] Provider Note: [name], MD, FACEP  CSN: [id number] MRN: [id number] ARRIVAL: 09/12/20 at 2200 ROOM: MH02/MH02   CHIEF COMPLAINT  Finger Injury (lac)   HISTORY OF PRESENT ILLNESS  09/13/20 12:46 AM Jay Davis is a 40 y.o. male who struck his left index finger with a hammer about 5:30 PM.  There is a 4 Mide laceration to the distal phalanx with associated ecchymosis.  He states associated pain is a 10 out of 10, worse with attempted movement or with palpation.  His tetanus is up-to-date.   Past Medical History:  Diagnosis Date   Asthma    Bipolar 1 disorder (HCC)    Depression    Diverticulitis     Past Surgical History:  Procedure Laterality Date   IR RADIOLOGIST EVAL & MGMT  08/17/2018    Family History  Problem Relation Age of Onset   Colon cancer Neg Hx    Esophageal cancer Neg Hx     Social History   Tobacco Use   Smoking status: Former    Pack years: 0.00    Types: Cigarettes   Smokeless tobacco: Never  Vaping Use   Vaping Use: Never used  Substance Use Topics   Alcohol use: Not Currently   Drug use: Yes    Types: Marijuana    Prior to Admission medications   Medication Sig Start Date End Date Taking? Authorizing Provider  cephALEXin (KEFLEX) 500 MG capsule Take 1 capsule (500 mg total) by mouth 4 (four) times daily. 09/13/20  Yes Jabez Molner, MD  acetaminophen (TYLENOL) 325 MG tablet Take 650 mg by mouth every 6 (six) hours as needed.    [provider]  albuterol (PROVENTIL HFA;VENTOLIN HFA) 108 (90 BASE) MCG/ACT inhaler Inhale 1-2 puffs into the lungs every 6 (six) hours as needed for wheezing. 01/04/13   Palumbo, April, MD  Multiple Vitamin (MULTIVITAMIN WITH MINERALS) TABS tablet Take 1 tablet by mouth daily. 08/08/18   Darlin Drop, DO  Simethicone (GAS-X PO) Take 1 tablet by mouth as needed.    [provider]    Allergies Ambien [zolpidem tartrate] and Pork-derived products   REVIEW OF  SYSTEMS  Negative except as noted here or in the History of Present Illness.   PHYSICAL EXAMINATION  Initial Vital Signs Blood pressure (!) 121/59, pulse 65, temperature 98 F (36.7 C), temperature source Oral, resp. rate 18, height 5\' 4"  (1.626 m), weight 93 kg, SpO2 98 %.  Examination General: Well-developed, well-nourished male in no acute distress; appearance consistent with age of record HENT: normocephalic; atraumatic Eyes: Normal appearance Neck: supple Heart: regular rate and rhythm Lungs: clear to auscultation bilaterally Abdomen: soft; nondistended; nontender; bowel sounds present Extremities: Ecchymosis, tenderness, deformity and laceration of distal phalanx of left index finger:    Neurologic: Awake, alert and oriented; motor function intact in all extremities and symmetric; no facial droop Skin: Warm and dry Psychiatric: Normal mood and affect   RESULTS  Summary of this visit's results, reviewed and interpreted by myself:   EKG Interpretation  Date/Time:    Ventricular Rate:    PR Interval:    QRS Duration:   QT Interval:    QTC Calculation:   R Axis:     Text Interpretation:          Laboratory Studies: No results found for this or any previous visit (from the past 24 hour(s)). Imaging Studies: DG Finger Index Left  Result Date: 09/12/2020 CLINICAL DATA:  Crush injury  EXAM: LEFT INDEX FINGER 2+V COMPARISON:  None. FINDINGS: Acute comminuted fracture involving the tuft of the second distal phalanx with separation of fracture fragments. No subluxation. No radiopaque foreign body in the soft tissues IMPRESSION: Acute comminuted fracture involving the tuft of the second distal phalanx Electronically Signed   By: Jasmine Pang M.D.   On: 09/12/2020 22:34    ED COURSE and MDM  Nursing notes, initial and subsequent vitals signs, including pulse oximetry, reviewed and interpreted by myself.  Vitals:   09/12/20 2213 09/12/20 2213 09/13/20 0011  BP:  (!)  130/99 (!) 121/59  Pulse:  99 65  Resp:  20 18  Temp:  98 F (36.7 C)   TempSrc:  Oral   SpO2:  96% 98%  Weight: 93 kg    Height: 5\' 4"  (1.626 m)     Medications  bupivacaine (MARCAINE) 0.5 % (with pres) injection 10 mL (10 mLs Infiltration Given by Other 09/13/20 0103)  ceFAZolin (ANCEF) IVPB 1 g/50 mL premix (0 g Intravenous Stopped 09/13/20 0103)   1:30 AM Discussed with Dr. 09/15/20 of hand surgery.  He will see the patient in his office after 2 PM this afternoon.  The patient stated he would be able to follow-up.  He was advised that he has an open fracture that requires the attention of a specialist and is not amenable to closure in the ED as he may need a pin or other specialty interventions.   PROCEDURES  Procedures LACERATION REPAIR Performed by: Orlan Leavens Charmin Aguiniga Authorized by: Carlisle Beers Emrick Hensch Consent: Verbal consent obtained. Risks and benefits: risks, benefits and alternatives were discussed Consent given by: patient Patient identity confirmed: provided demographic data Prepped in normal sterile fashion   Laceration Location: Left index finger distal phalanx  Laceration Length: 1 cm  Anesthesia: Digital block  Local anesthetic: Bupivacaine 0.5% without epinephrine  Anesthetic total: 5 ml  Irrigation method: syringe Amount of cleaning: Copious  Skin closure: Deferred  Patient tolerance: Patient tolerated the procedure well with no immediate complications.   ED DIAGNOSES     ICD-10-CM   1. Open displaced fracture of distal phalanx of left index finger, initial encounter  S62.631B     2. Crushing injury of finger, initial encounter  S67.10XA          Christohper Dube, Carlisle Beers, MD 09/13/20 [phone]

## 2021-02-14 DEATH — deceased

## 2022-01-18 IMAGING — CR DG CHEST 2V
2 series · 2 of 2 positions shown · non-contrast
Comparison: 11/06/2012

CLINICAL DATA: Intermittent chest pain for 1 week.

EXAM:
CHEST - 2 VIEW

[w chest pa]
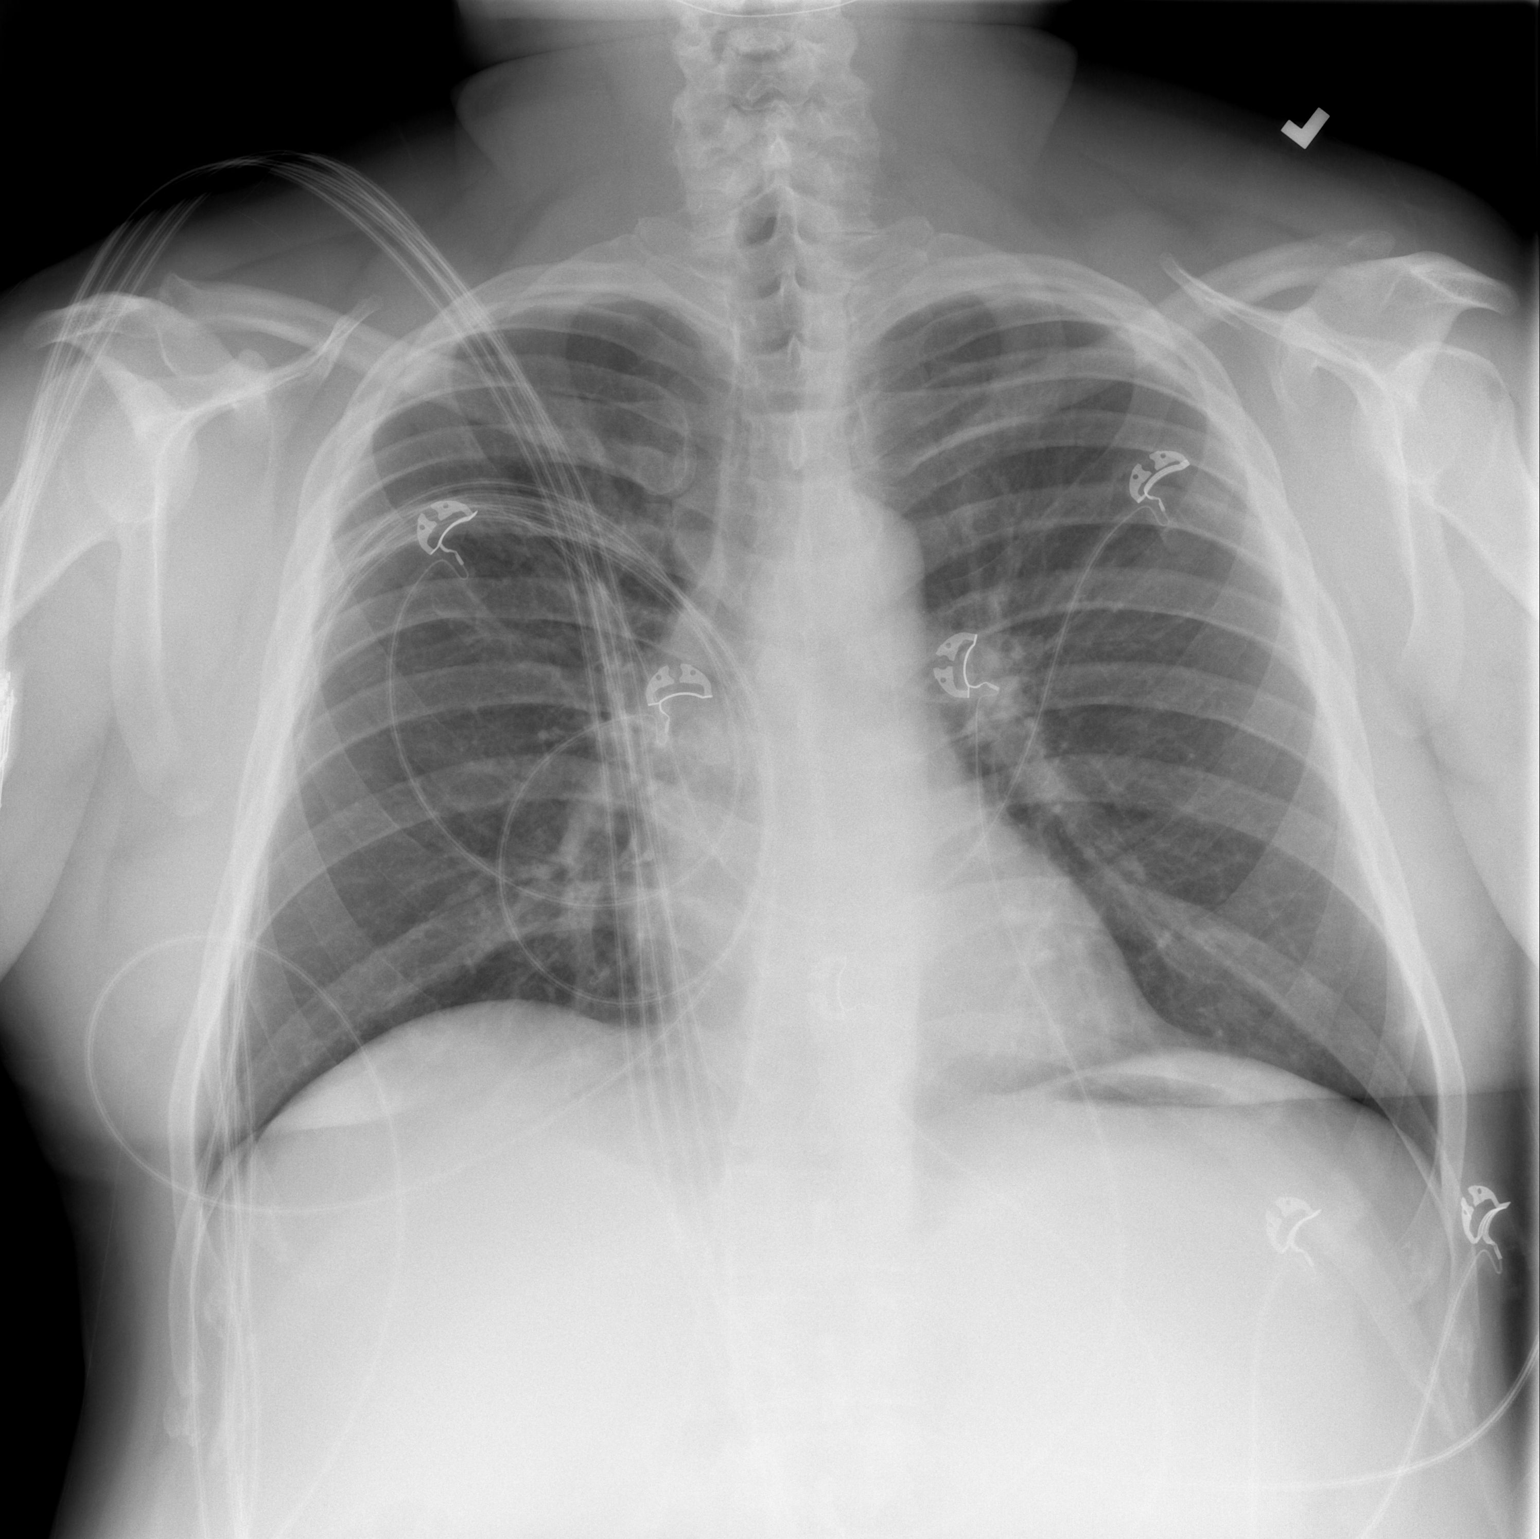

[w chest lat]
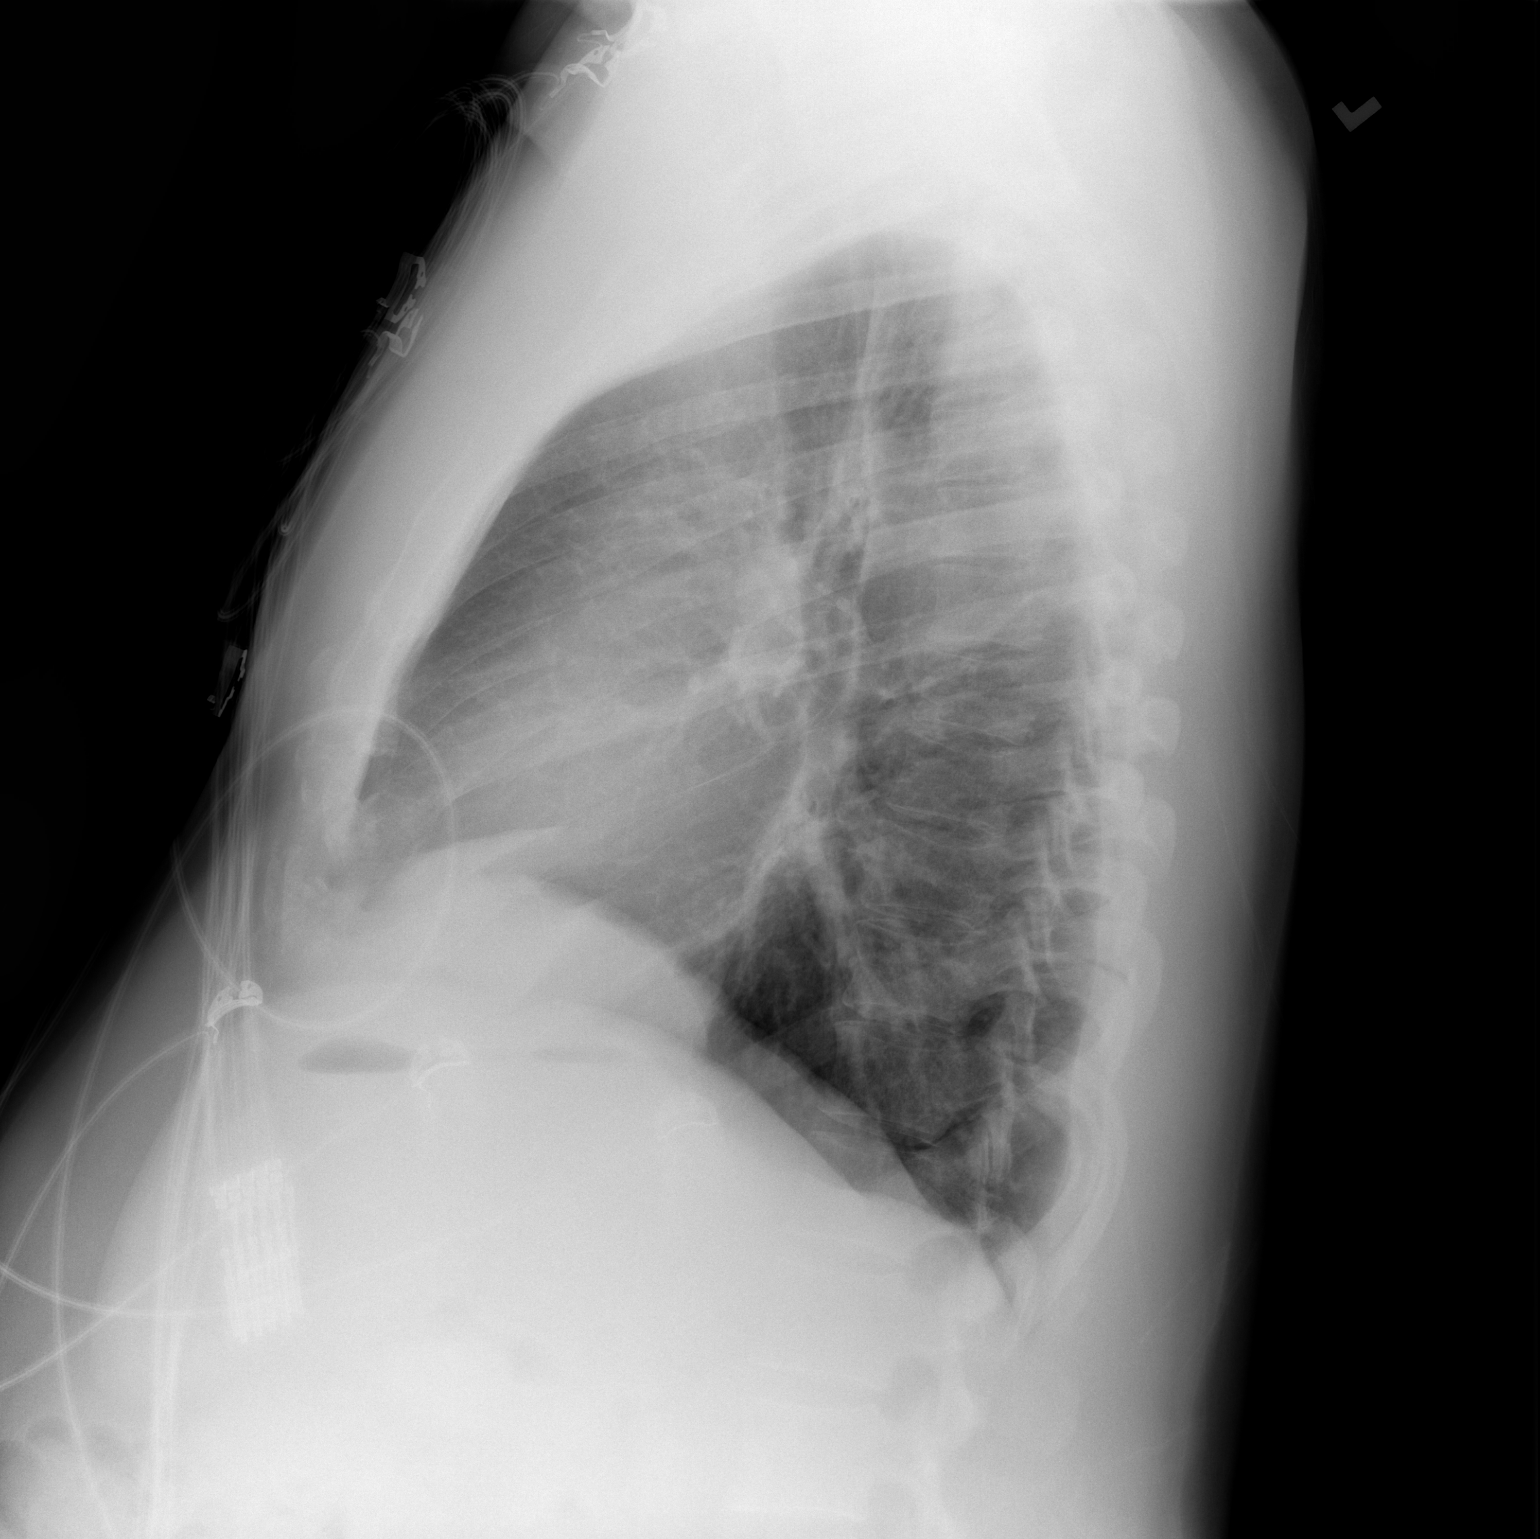

[2 of 2 positions shown; findings below may reference images not displayed]

FINDINGS: The cardiomediastinal contours are normal. The lungs are clear.
Pulmonary vasculature is normal. No consolidation, pleural effusion,
or pneumothorax. No acute osseous abnormalities are seen.
IMPRESSION: Negative radiographs of the chest.
